# Patient Record
Sex: Male | Born: 1945
Health system: Southern US, Community
[De-identification: ages and names within clinical notes are randomized; demographics above are authoritative.]

## PROBLEM LIST (undated history)

## (undated) DIAGNOSIS — J302 Other seasonal allergic rhinitis: Secondary | ICD-10-CM

## (undated) DIAGNOSIS — I8390 Asymptomatic varicose veins of unspecified lower extremity: Secondary | ICD-10-CM

## (undated) DIAGNOSIS — I2510 Atherosclerotic heart disease of native coronary artery without angina pectoris: Secondary | ICD-10-CM

## (undated) DIAGNOSIS — J9819 Other pulmonary collapse: Secondary | ICD-10-CM

## (undated) DIAGNOSIS — S2239XA Fracture of one rib, unspecified side, initial encounter for closed fracture: Secondary | ICD-10-CM

## (undated) DIAGNOSIS — I10 Essential (primary) hypertension: Secondary | ICD-10-CM

## (undated) DIAGNOSIS — L859 Epidermal thickening, unspecified: Secondary | ICD-10-CM

## (undated) DIAGNOSIS — N400 Enlarged prostate without lower urinary tract symptoms: Secondary | ICD-10-CM

## (undated) DIAGNOSIS — H9319 Tinnitus, unspecified ear: Secondary | ICD-10-CM

## (undated) DIAGNOSIS — Z87442 Personal history of urinary calculi: Secondary | ICD-10-CM

## (undated) HISTORY — PX: CORONARY ARTERY BYPASS GRAFT: SHX141

## (undated) HISTORY — DX: Other seasonal allergic rhinitis: J30.2

## (undated) HISTORY — DX: Personal history of urinary calculi: Z87.442

## (undated) HISTORY — DX: Other pulmonary collapse: J98.19

## (undated) HISTORY — DX: Tinnitus, unspecified ear: H93.19

## (undated) HISTORY — DX: Asymptomatic varicose veins of unspecified lower extremity: I83.90

## (undated) HISTORY — DX: Epidermal thickening, unspecified: L85.9

## (undated) HISTORY — DX: Atherosclerotic heart disease of native coronary artery without angina pectoris: I25.10

## (undated) HISTORY — DX: Fracture of one rib, unspecified side, initial encounter for closed fracture: S22.39XA

## (undated) HISTORY — DX: Benign prostatic hyperplasia without lower urinary tract symptoms: N40.0

## (undated) HISTORY — DX: Essential (primary) hypertension: I10

---

## 1979-06-02 HISTORY — PX: NOSE SURGERY: SHX723

## 1986-06-01 HISTORY — PX: TRANSURETHRAL RESECTION OF PROSTATE: SHX73

## 2003-06-02 HISTORY — PX: HERNIA REPAIR: SHX51

## 2012-06-01 HISTORY — PX: VARICOSE VEIN SURGERY: SHX832

## 2015-07-08 DIAGNOSIS — I358 Other nonrheumatic aortic valve disorders: Secondary | ICD-10-CM | POA: Diagnosis not present

## 2015-07-08 DIAGNOSIS — Z87442 Personal history of urinary calculi: Secondary | ICD-10-CM | POA: Diagnosis not present

## 2015-07-08 DIAGNOSIS — I2510 Atherosclerotic heart disease of native coronary artery without angina pectoris: Secondary | ICD-10-CM | POA: Diagnosis not present

## 2015-07-08 DIAGNOSIS — I679 Cerebrovascular disease, unspecified: Secondary | ICD-10-CM | POA: Diagnosis not present

## 2015-07-08 DIAGNOSIS — I519 Heart disease, unspecified: Secondary | ICD-10-CM | POA: Diagnosis not present

## 2015-07-08 DIAGNOSIS — I6523 Occlusion and stenosis of bilateral carotid arteries: Secondary | ICD-10-CM | POA: Diagnosis not present

## 2015-07-08 DIAGNOSIS — J00 Acute nasopharyngitis [common cold]: Secondary | ICD-10-CM | POA: Diagnosis not present

## 2015-07-08 DIAGNOSIS — K429 Umbilical hernia without obstruction or gangrene: Secondary | ICD-10-CM | POA: Diagnosis not present

## 2015-07-08 DIAGNOSIS — I77810 Thoracic aortic ectasia: Secondary | ICD-10-CM | POA: Diagnosis not present

## 2015-07-08 DIAGNOSIS — Z6828 Body mass index (BMI) 28.0-28.9, adult: Secondary | ICD-10-CM | POA: Diagnosis not present

## 2015-07-08 DIAGNOSIS — N401 Enlarged prostate with lower urinary tract symptoms: Secondary | ICD-10-CM | POA: Diagnosis not present

## 2015-07-08 DIAGNOSIS — Z951 Presence of aortocoronary bypass graft: Secondary | ICD-10-CM | POA: Diagnosis not present

## 2015-07-08 DIAGNOSIS — I10 Essential (primary) hypertension: Secondary | ICD-10-CM | POA: Diagnosis not present

## 2015-07-08 DIAGNOSIS — E785 Hyperlipidemia, unspecified: Secondary | ICD-10-CM | POA: Diagnosis not present

## 2015-09-08 DIAGNOSIS — J0410 Acute tracheitis without obstruction: Secondary | ICD-10-CM | POA: Diagnosis not present

## 2016-01-07 DIAGNOSIS — I6523 Occlusion and stenosis of bilateral carotid arteries: Secondary | ICD-10-CM | POA: Diagnosis not present

## 2016-01-07 DIAGNOSIS — Z951 Presence of aortocoronary bypass graft: Secondary | ICD-10-CM | POA: Diagnosis not present

## 2016-01-07 DIAGNOSIS — E785 Hyperlipidemia, unspecified: Secondary | ICD-10-CM | POA: Diagnosis not present

## 2016-01-07 DIAGNOSIS — I10 Essential (primary) hypertension: Secondary | ICD-10-CM | POA: Diagnosis not present

## 2016-01-07 DIAGNOSIS — R7301 Impaired fasting glucose: Secondary | ICD-10-CM | POA: Diagnosis not present

## 2016-01-07 DIAGNOSIS — I2510 Atherosclerotic heart disease of native coronary artery without angina pectoris: Secondary | ICD-10-CM | POA: Diagnosis not present

## 2016-01-07 DIAGNOSIS — Z6828 Body mass index (BMI) 28.0-28.9, adult: Secondary | ICD-10-CM | POA: Diagnosis not present

## 2016-01-07 DIAGNOSIS — I519 Heart disease, unspecified: Secondary | ICD-10-CM | POA: Diagnosis not present

## 2016-06-03 DIAGNOSIS — I77810 Thoracic aortic ectasia: Secondary | ICD-10-CM | POA: Diagnosis not present

## 2016-06-03 DIAGNOSIS — I358 Other nonrheumatic aortic valve disorders: Secondary | ICD-10-CM | POA: Diagnosis not present

## 2016-06-03 DIAGNOSIS — R809 Proteinuria, unspecified: Secondary | ICD-10-CM | POA: Diagnosis not present

## 2016-06-03 DIAGNOSIS — Z87442 Personal history of urinary calculi: Secondary | ICD-10-CM | POA: Diagnosis not present

## 2016-06-03 DIAGNOSIS — R319 Hematuria, unspecified: Secondary | ICD-10-CM | POA: Diagnosis not present

## 2016-06-03 DIAGNOSIS — R310 Gross hematuria: Secondary | ICD-10-CM | POA: Diagnosis not present

## 2016-07-07 DIAGNOSIS — I519 Heart disease, unspecified: Secondary | ICD-10-CM | POA: Diagnosis not present

## 2016-07-07 DIAGNOSIS — I679 Cerebrovascular disease, unspecified: Secondary | ICD-10-CM | POA: Diagnosis not present

## 2016-07-07 DIAGNOSIS — I10 Essential (primary) hypertension: Secondary | ICD-10-CM | POA: Diagnosis not present

## 2016-07-07 DIAGNOSIS — Z951 Presence of aortocoronary bypass graft: Secondary | ICD-10-CM | POA: Diagnosis not present

## 2016-07-07 DIAGNOSIS — I493 Ventricular premature depolarization: Secondary | ICD-10-CM | POA: Diagnosis not present

## 2016-07-07 DIAGNOSIS — E785 Hyperlipidemia, unspecified: Secondary | ICD-10-CM | POA: Diagnosis not present

## 2016-07-07 DIAGNOSIS — I6523 Occlusion and stenosis of bilateral carotid arteries: Secondary | ICD-10-CM | POA: Diagnosis not present

## 2016-07-07 DIAGNOSIS — Z1211 Encounter for screening for malignant neoplasm of colon: Secondary | ICD-10-CM | POA: Diagnosis not present

## 2016-07-07 DIAGNOSIS — I2510 Atherosclerotic heart disease of native coronary artery without angina pectoris: Secondary | ICD-10-CM | POA: Diagnosis not present

## 2016-07-07 DIAGNOSIS — I77810 Thoracic aortic ectasia: Secondary | ICD-10-CM | POA: Diagnosis not present

## 2017-02-15 DIAGNOSIS — M5387 Other specified dorsopathies, lumbosacral region: Secondary | ICD-10-CM | POA: Diagnosis not present

## 2017-02-15 DIAGNOSIS — I25810 Atherosclerosis of coronary artery bypass graft(s) without angina pectoris: Secondary | ICD-10-CM | POA: Diagnosis not present

## 2017-02-15 DIAGNOSIS — M5136 Other intervertebral disc degeneration, lumbar region: Secondary | ICD-10-CM | POA: Diagnosis not present

## 2017-02-15 DIAGNOSIS — Z1389 Encounter for screening for other disorder: Secondary | ICD-10-CM | POA: Diagnosis not present

## 2017-02-15 DIAGNOSIS — N400 Enlarged prostate without lower urinary tract symptoms: Secondary | ICD-10-CM | POA: Diagnosis not present

## 2017-02-15 DIAGNOSIS — M9904 Segmental and somatic dysfunction of sacral region: Secondary | ICD-10-CM | POA: Diagnosis not present

## 2017-02-15 DIAGNOSIS — M9903 Segmental and somatic dysfunction of lumbar region: Secondary | ICD-10-CM | POA: Diagnosis not present

## 2017-02-15 DIAGNOSIS — Q72812 Congenital shortening of left lower limb: Secondary | ICD-10-CM | POA: Diagnosis not present

## 2017-02-15 DIAGNOSIS — M5386 Other specified dorsopathies, lumbar region: Secondary | ICD-10-CM | POA: Diagnosis not present

## 2017-02-15 DIAGNOSIS — E7800 Pure hypercholesterolemia, unspecified: Secondary | ICD-10-CM | POA: Diagnosis not present

## 2017-02-15 DIAGNOSIS — M9905 Segmental and somatic dysfunction of pelvic region: Secondary | ICD-10-CM | POA: Diagnosis not present

## 2017-02-15 DIAGNOSIS — I10 Essential (primary) hypertension: Secondary | ICD-10-CM | POA: Diagnosis not present

## 2017-02-15 DIAGNOSIS — M5137 Other intervertebral disc degeneration, lumbosacral region: Secondary | ICD-10-CM | POA: Diagnosis not present

## 2017-02-15 DIAGNOSIS — N200 Calculus of kidney: Secondary | ICD-10-CM | POA: Diagnosis not present

## 2017-02-18 DIAGNOSIS — Q72812 Congenital shortening of left lower limb: Secondary | ICD-10-CM | POA: Diagnosis not present

## 2017-02-18 DIAGNOSIS — M5136 Other intervertebral disc degeneration, lumbar region: Secondary | ICD-10-CM | POA: Diagnosis not present

## 2017-02-18 DIAGNOSIS — M9904 Segmental and somatic dysfunction of sacral region: Secondary | ICD-10-CM | POA: Diagnosis not present

## 2017-02-18 DIAGNOSIS — M9905 Segmental and somatic dysfunction of pelvic region: Secondary | ICD-10-CM | POA: Diagnosis not present

## 2017-02-18 DIAGNOSIS — M5137 Other intervertebral disc degeneration, lumbosacral region: Secondary | ICD-10-CM | POA: Diagnosis not present

## 2017-02-18 DIAGNOSIS — M5387 Other specified dorsopathies, lumbosacral region: Secondary | ICD-10-CM | POA: Diagnosis not present

## 2017-02-18 DIAGNOSIS — M9903 Segmental and somatic dysfunction of lumbar region: Secondary | ICD-10-CM | POA: Diagnosis not present

## 2017-02-18 DIAGNOSIS — M5386 Other specified dorsopathies, lumbar region: Secondary | ICD-10-CM | POA: Diagnosis not present

## 2017-02-23 DIAGNOSIS — M5386 Other specified dorsopathies, lumbar region: Secondary | ICD-10-CM | POA: Diagnosis not present

## 2017-02-23 DIAGNOSIS — M5137 Other intervertebral disc degeneration, lumbosacral region: Secondary | ICD-10-CM | POA: Diagnosis not present

## 2017-02-23 DIAGNOSIS — M9905 Segmental and somatic dysfunction of pelvic region: Secondary | ICD-10-CM | POA: Diagnosis not present

## 2017-02-23 DIAGNOSIS — M5387 Other specified dorsopathies, lumbosacral region: Secondary | ICD-10-CM | POA: Diagnosis not present

## 2017-02-23 DIAGNOSIS — M9904 Segmental and somatic dysfunction of sacral region: Secondary | ICD-10-CM | POA: Diagnosis not present

## 2017-02-23 DIAGNOSIS — M5136 Other intervertebral disc degeneration, lumbar region: Secondary | ICD-10-CM | POA: Diagnosis not present

## 2017-02-23 DIAGNOSIS — Q72812 Congenital shortening of left lower limb: Secondary | ICD-10-CM | POA: Diagnosis not present

## 2017-02-23 DIAGNOSIS — M9903 Segmental and somatic dysfunction of lumbar region: Secondary | ICD-10-CM | POA: Diagnosis not present

## 2017-02-24 DIAGNOSIS — Q72812 Congenital shortening of left lower limb: Secondary | ICD-10-CM | POA: Diagnosis not present

## 2017-02-24 DIAGNOSIS — M5137 Other intervertebral disc degeneration, lumbosacral region: Secondary | ICD-10-CM | POA: Diagnosis not present

## 2017-02-24 DIAGNOSIS — M5387 Other specified dorsopathies, lumbosacral region: Secondary | ICD-10-CM | POA: Diagnosis not present

## 2017-02-24 DIAGNOSIS — M9904 Segmental and somatic dysfunction of sacral region: Secondary | ICD-10-CM | POA: Diagnosis not present

## 2017-02-24 DIAGNOSIS — M5386 Other specified dorsopathies, lumbar region: Secondary | ICD-10-CM | POA: Diagnosis not present

## 2017-02-24 DIAGNOSIS — M5136 Other intervertebral disc degeneration, lumbar region: Secondary | ICD-10-CM | POA: Diagnosis not present

## 2017-02-24 DIAGNOSIS — M9903 Segmental and somatic dysfunction of lumbar region: Secondary | ICD-10-CM | POA: Diagnosis not present

## 2017-02-24 DIAGNOSIS — M9905 Segmental and somatic dysfunction of pelvic region: Secondary | ICD-10-CM | POA: Diagnosis not present

## 2017-02-25 DIAGNOSIS — M9904 Segmental and somatic dysfunction of sacral region: Secondary | ICD-10-CM | POA: Diagnosis not present

## 2017-02-25 DIAGNOSIS — M5386 Other specified dorsopathies, lumbar region: Secondary | ICD-10-CM | POA: Diagnosis not present

## 2017-02-25 DIAGNOSIS — M5137 Other intervertebral disc degeneration, lumbosacral region: Secondary | ICD-10-CM | POA: Diagnosis not present

## 2017-02-25 DIAGNOSIS — M9903 Segmental and somatic dysfunction of lumbar region: Secondary | ICD-10-CM | POA: Diagnosis not present

## 2017-02-25 DIAGNOSIS — M9905 Segmental and somatic dysfunction of pelvic region: Secondary | ICD-10-CM | POA: Diagnosis not present

## 2017-02-25 DIAGNOSIS — M5136 Other intervertebral disc degeneration, lumbar region: Secondary | ICD-10-CM | POA: Diagnosis not present

## 2017-02-25 DIAGNOSIS — Q72812 Congenital shortening of left lower limb: Secondary | ICD-10-CM | POA: Diagnosis not present

## 2017-02-25 DIAGNOSIS — M5387 Other specified dorsopathies, lumbosacral region: Secondary | ICD-10-CM | POA: Diagnosis not present

## 2017-03-01 DIAGNOSIS — M5137 Other intervertebral disc degeneration, lumbosacral region: Secondary | ICD-10-CM | POA: Diagnosis not present

## 2017-03-01 DIAGNOSIS — M5136 Other intervertebral disc degeneration, lumbar region: Secondary | ICD-10-CM | POA: Diagnosis not present

## 2017-03-01 DIAGNOSIS — M5386 Other specified dorsopathies, lumbar region: Secondary | ICD-10-CM | POA: Diagnosis not present

## 2017-03-01 DIAGNOSIS — M9905 Segmental and somatic dysfunction of pelvic region: Secondary | ICD-10-CM | POA: Diagnosis not present

## 2017-03-01 DIAGNOSIS — Q72812 Congenital shortening of left lower limb: Secondary | ICD-10-CM | POA: Diagnosis not present

## 2017-03-01 DIAGNOSIS — M9903 Segmental and somatic dysfunction of lumbar region: Secondary | ICD-10-CM | POA: Diagnosis not present

## 2017-03-01 DIAGNOSIS — M9904 Segmental and somatic dysfunction of sacral region: Secondary | ICD-10-CM | POA: Diagnosis not present

## 2017-03-01 DIAGNOSIS — M5387 Other specified dorsopathies, lumbosacral region: Secondary | ICD-10-CM | POA: Diagnosis not present

## 2017-03-08 DIAGNOSIS — M5136 Other intervertebral disc degeneration, lumbar region: Secondary | ICD-10-CM | POA: Diagnosis not present

## 2017-03-08 DIAGNOSIS — M5387 Other specified dorsopathies, lumbosacral region: Secondary | ICD-10-CM | POA: Diagnosis not present

## 2017-03-08 DIAGNOSIS — M9904 Segmental and somatic dysfunction of sacral region: Secondary | ICD-10-CM | POA: Diagnosis not present

## 2017-03-08 DIAGNOSIS — M9905 Segmental and somatic dysfunction of pelvic region: Secondary | ICD-10-CM | POA: Diagnosis not present

## 2017-03-08 DIAGNOSIS — M5137 Other intervertebral disc degeneration, lumbosacral region: Secondary | ICD-10-CM | POA: Diagnosis not present

## 2017-03-08 DIAGNOSIS — M5386 Other specified dorsopathies, lumbar region: Secondary | ICD-10-CM | POA: Diagnosis not present

## 2017-03-08 DIAGNOSIS — Q72812 Congenital shortening of left lower limb: Secondary | ICD-10-CM | POA: Diagnosis not present

## 2017-03-08 DIAGNOSIS — M9903 Segmental and somatic dysfunction of lumbar region: Secondary | ICD-10-CM | POA: Diagnosis not present

## 2017-03-09 DIAGNOSIS — M9905 Segmental and somatic dysfunction of pelvic region: Secondary | ICD-10-CM | POA: Diagnosis not present

## 2017-03-09 DIAGNOSIS — Q72812 Congenital shortening of left lower limb: Secondary | ICD-10-CM | POA: Diagnosis not present

## 2017-03-09 DIAGNOSIS — M5136 Other intervertebral disc degeneration, lumbar region: Secondary | ICD-10-CM | POA: Diagnosis not present

## 2017-03-09 DIAGNOSIS — M5387 Other specified dorsopathies, lumbosacral region: Secondary | ICD-10-CM | POA: Diagnosis not present

## 2017-03-09 DIAGNOSIS — M5137 Other intervertebral disc degeneration, lumbosacral region: Secondary | ICD-10-CM | POA: Diagnosis not present

## 2017-03-09 DIAGNOSIS — M5386 Other specified dorsopathies, lumbar region: Secondary | ICD-10-CM | POA: Diagnosis not present

## 2017-03-09 DIAGNOSIS — M9903 Segmental and somatic dysfunction of lumbar region: Secondary | ICD-10-CM | POA: Diagnosis not present

## 2017-03-09 DIAGNOSIS — M9904 Segmental and somatic dysfunction of sacral region: Secondary | ICD-10-CM | POA: Diagnosis not present

## 2017-03-11 DIAGNOSIS — M5387 Other specified dorsopathies, lumbosacral region: Secondary | ICD-10-CM | POA: Diagnosis not present

## 2017-03-11 DIAGNOSIS — M5386 Other specified dorsopathies, lumbar region: Secondary | ICD-10-CM | POA: Diagnosis not present

## 2017-03-11 DIAGNOSIS — M9903 Segmental and somatic dysfunction of lumbar region: Secondary | ICD-10-CM | POA: Diagnosis not present

## 2017-03-11 DIAGNOSIS — Q72812 Congenital shortening of left lower limb: Secondary | ICD-10-CM | POA: Diagnosis not present

## 2017-03-11 DIAGNOSIS — M9905 Segmental and somatic dysfunction of pelvic region: Secondary | ICD-10-CM | POA: Diagnosis not present

## 2017-03-11 DIAGNOSIS — M9904 Segmental and somatic dysfunction of sacral region: Secondary | ICD-10-CM | POA: Diagnosis not present

## 2017-03-11 DIAGNOSIS — M5136 Other intervertebral disc degeneration, lumbar region: Secondary | ICD-10-CM | POA: Diagnosis not present

## 2017-03-11 DIAGNOSIS — M5137 Other intervertebral disc degeneration, lumbosacral region: Secondary | ICD-10-CM | POA: Diagnosis not present

## 2017-03-15 DIAGNOSIS — M9903 Segmental and somatic dysfunction of lumbar region: Secondary | ICD-10-CM | POA: Diagnosis not present

## 2017-03-15 DIAGNOSIS — M5386 Other specified dorsopathies, lumbar region: Secondary | ICD-10-CM | POA: Diagnosis not present

## 2017-03-15 DIAGNOSIS — M5136 Other intervertebral disc degeneration, lumbar region: Secondary | ICD-10-CM | POA: Diagnosis not present

## 2017-03-15 DIAGNOSIS — Q72812 Congenital shortening of left lower limb: Secondary | ICD-10-CM | POA: Diagnosis not present

## 2017-03-15 DIAGNOSIS — M5137 Other intervertebral disc degeneration, lumbosacral region: Secondary | ICD-10-CM | POA: Diagnosis not present

## 2017-03-15 DIAGNOSIS — M9905 Segmental and somatic dysfunction of pelvic region: Secondary | ICD-10-CM | POA: Diagnosis not present

## 2017-03-15 DIAGNOSIS — M5387 Other specified dorsopathies, lumbosacral region: Secondary | ICD-10-CM | POA: Diagnosis not present

## 2017-03-15 DIAGNOSIS — M9904 Segmental and somatic dysfunction of sacral region: Secondary | ICD-10-CM | POA: Diagnosis not present

## 2017-03-16 DIAGNOSIS — M5387 Other specified dorsopathies, lumbosacral region: Secondary | ICD-10-CM | POA: Diagnosis not present

## 2017-03-16 DIAGNOSIS — M5386 Other specified dorsopathies, lumbar region: Secondary | ICD-10-CM | POA: Diagnosis not present

## 2017-03-16 DIAGNOSIS — M9903 Segmental and somatic dysfunction of lumbar region: Secondary | ICD-10-CM | POA: Diagnosis not present

## 2017-03-16 DIAGNOSIS — Q72812 Congenital shortening of left lower limb: Secondary | ICD-10-CM | POA: Diagnosis not present

## 2017-03-16 DIAGNOSIS — M5136 Other intervertebral disc degeneration, lumbar region: Secondary | ICD-10-CM | POA: Diagnosis not present

## 2017-03-16 DIAGNOSIS — M5137 Other intervertebral disc degeneration, lumbosacral region: Secondary | ICD-10-CM | POA: Diagnosis not present

## 2017-03-16 DIAGNOSIS — M9904 Segmental and somatic dysfunction of sacral region: Secondary | ICD-10-CM | POA: Diagnosis not present

## 2017-03-16 DIAGNOSIS — M9905 Segmental and somatic dysfunction of pelvic region: Secondary | ICD-10-CM | POA: Diagnosis not present

## 2017-03-18 DIAGNOSIS — M5387 Other specified dorsopathies, lumbosacral region: Secondary | ICD-10-CM | POA: Diagnosis not present

## 2017-03-18 DIAGNOSIS — M9905 Segmental and somatic dysfunction of pelvic region: Secondary | ICD-10-CM | POA: Diagnosis not present

## 2017-03-18 DIAGNOSIS — M5386 Other specified dorsopathies, lumbar region: Secondary | ICD-10-CM | POA: Diagnosis not present

## 2017-03-18 DIAGNOSIS — Q72812 Congenital shortening of left lower limb: Secondary | ICD-10-CM | POA: Diagnosis not present

## 2017-03-18 DIAGNOSIS — M5136 Other intervertebral disc degeneration, lumbar region: Secondary | ICD-10-CM | POA: Diagnosis not present

## 2017-03-18 DIAGNOSIS — M5137 Other intervertebral disc degeneration, lumbosacral region: Secondary | ICD-10-CM | POA: Diagnosis not present

## 2017-03-18 DIAGNOSIS — M9903 Segmental and somatic dysfunction of lumbar region: Secondary | ICD-10-CM | POA: Diagnosis not present

## 2017-03-18 DIAGNOSIS — M9904 Segmental and somatic dysfunction of sacral region: Secondary | ICD-10-CM | POA: Diagnosis not present

## 2017-03-22 DIAGNOSIS — Q72812 Congenital shortening of left lower limb: Secondary | ICD-10-CM | POA: Diagnosis not present

## 2017-03-22 DIAGNOSIS — M5387 Other specified dorsopathies, lumbosacral region: Secondary | ICD-10-CM | POA: Diagnosis not present

## 2017-03-22 DIAGNOSIS — M9903 Segmental and somatic dysfunction of lumbar region: Secondary | ICD-10-CM | POA: Diagnosis not present

## 2017-03-22 DIAGNOSIS — M9905 Segmental and somatic dysfunction of pelvic region: Secondary | ICD-10-CM | POA: Diagnosis not present

## 2017-03-22 DIAGNOSIS — M9904 Segmental and somatic dysfunction of sacral region: Secondary | ICD-10-CM | POA: Diagnosis not present

## 2017-03-22 DIAGNOSIS — M5386 Other specified dorsopathies, lumbar region: Secondary | ICD-10-CM | POA: Diagnosis not present

## 2017-03-22 DIAGNOSIS — M5137 Other intervertebral disc degeneration, lumbosacral region: Secondary | ICD-10-CM | POA: Diagnosis not present

## 2017-03-22 DIAGNOSIS — M5136 Other intervertebral disc degeneration, lumbar region: Secondary | ICD-10-CM | POA: Diagnosis not present

## 2017-03-24 DIAGNOSIS — M9903 Segmental and somatic dysfunction of lumbar region: Secondary | ICD-10-CM | POA: Diagnosis not present

## 2017-03-24 DIAGNOSIS — M9904 Segmental and somatic dysfunction of sacral region: Secondary | ICD-10-CM | POA: Diagnosis not present

## 2017-03-24 DIAGNOSIS — M5137 Other intervertebral disc degeneration, lumbosacral region: Secondary | ICD-10-CM | POA: Diagnosis not present

## 2017-03-24 DIAGNOSIS — Q72812 Congenital shortening of left lower limb: Secondary | ICD-10-CM | POA: Diagnosis not present

## 2017-03-24 DIAGNOSIS — M5136 Other intervertebral disc degeneration, lumbar region: Secondary | ICD-10-CM | POA: Diagnosis not present

## 2017-03-24 DIAGNOSIS — M5387 Other specified dorsopathies, lumbosacral region: Secondary | ICD-10-CM | POA: Diagnosis not present

## 2017-03-24 DIAGNOSIS — M5386 Other specified dorsopathies, lumbar region: Secondary | ICD-10-CM | POA: Diagnosis not present

## 2017-03-24 DIAGNOSIS — M9905 Segmental and somatic dysfunction of pelvic region: Secondary | ICD-10-CM | POA: Diagnosis not present

## 2017-03-25 DIAGNOSIS — M9903 Segmental and somatic dysfunction of lumbar region: Secondary | ICD-10-CM | POA: Diagnosis not present

## 2017-03-25 DIAGNOSIS — M9904 Segmental and somatic dysfunction of sacral region: Secondary | ICD-10-CM | POA: Diagnosis not present

## 2017-03-25 DIAGNOSIS — Q72812 Congenital shortening of left lower limb: Secondary | ICD-10-CM | POA: Diagnosis not present

## 2017-03-25 DIAGNOSIS — M5386 Other specified dorsopathies, lumbar region: Secondary | ICD-10-CM | POA: Diagnosis not present

## 2017-03-25 DIAGNOSIS — M5137 Other intervertebral disc degeneration, lumbosacral region: Secondary | ICD-10-CM | POA: Diagnosis not present

## 2017-03-25 DIAGNOSIS — M5387 Other specified dorsopathies, lumbosacral region: Secondary | ICD-10-CM | POA: Diagnosis not present

## 2017-03-25 DIAGNOSIS — M9905 Segmental and somatic dysfunction of pelvic region: Secondary | ICD-10-CM | POA: Diagnosis not present

## 2017-03-25 DIAGNOSIS — M5136 Other intervertebral disc degeneration, lumbar region: Secondary | ICD-10-CM | POA: Diagnosis not present

## 2017-03-29 DIAGNOSIS — M5136 Other intervertebral disc degeneration, lumbar region: Secondary | ICD-10-CM | POA: Diagnosis not present

## 2017-03-29 DIAGNOSIS — M9903 Segmental and somatic dysfunction of lumbar region: Secondary | ICD-10-CM | POA: Diagnosis not present

## 2017-03-29 DIAGNOSIS — M9904 Segmental and somatic dysfunction of sacral region: Secondary | ICD-10-CM | POA: Diagnosis not present

## 2017-03-29 DIAGNOSIS — M5137 Other intervertebral disc degeneration, lumbosacral region: Secondary | ICD-10-CM | POA: Diagnosis not present

## 2017-03-29 DIAGNOSIS — M9905 Segmental and somatic dysfunction of pelvic region: Secondary | ICD-10-CM | POA: Diagnosis not present

## 2017-03-29 DIAGNOSIS — M5386 Other specified dorsopathies, lumbar region: Secondary | ICD-10-CM | POA: Diagnosis not present

## 2017-03-29 DIAGNOSIS — M5387 Other specified dorsopathies, lumbosacral region: Secondary | ICD-10-CM | POA: Diagnosis not present

## 2017-03-29 DIAGNOSIS — Q72812 Congenital shortening of left lower limb: Secondary | ICD-10-CM | POA: Diagnosis not present

## 2017-03-31 DIAGNOSIS — M9905 Segmental and somatic dysfunction of pelvic region: Secondary | ICD-10-CM | POA: Diagnosis not present

## 2017-03-31 DIAGNOSIS — Q72812 Congenital shortening of left lower limb: Secondary | ICD-10-CM | POA: Diagnosis not present

## 2017-03-31 DIAGNOSIS — M9903 Segmental and somatic dysfunction of lumbar region: Secondary | ICD-10-CM | POA: Diagnosis not present

## 2017-03-31 DIAGNOSIS — M5386 Other specified dorsopathies, lumbar region: Secondary | ICD-10-CM | POA: Diagnosis not present

## 2017-03-31 DIAGNOSIS — M5387 Other specified dorsopathies, lumbosacral region: Secondary | ICD-10-CM | POA: Diagnosis not present

## 2017-03-31 DIAGNOSIS — M9904 Segmental and somatic dysfunction of sacral region: Secondary | ICD-10-CM | POA: Diagnosis not present

## 2017-03-31 DIAGNOSIS — M5136 Other intervertebral disc degeneration, lumbar region: Secondary | ICD-10-CM | POA: Diagnosis not present

## 2017-03-31 DIAGNOSIS — M5137 Other intervertebral disc degeneration, lumbosacral region: Secondary | ICD-10-CM | POA: Diagnosis not present

## 2017-04-07 ENCOUNTER — Emergency Department (HOSPITAL_COMMUNITY)
Admission: EM | Admit: 2017-04-07 | Discharge: 2017-04-07 | Disposition: A | Discharge status: Home / Self Care | Payer: Medicare Other | Attending: Emergency Medicine | Admitting: Emergency Medicine

## 2017-04-07 ENCOUNTER — Encounter (HOSPITAL_COMMUNITY): Payer: Self-pay | Admitting: *Deleted

## 2017-04-07 ENCOUNTER — Emergency Department (HOSPITAL_COMMUNITY): Payer: Medicare Other

## 2017-04-07 DIAGNOSIS — M25562 Pain in left knee: Secondary | ICD-10-CM | POA: Insufficient documentation

## 2017-04-07 DIAGNOSIS — Z791 Long term (current) use of non-steroidal anti-inflammatories (NSAID): Secondary | ICD-10-CM | POA: Diagnosis not present

## 2017-04-07 DIAGNOSIS — G8929 Other chronic pain: Secondary | ICD-10-CM | POA: Diagnosis not present

## 2017-04-07 DIAGNOSIS — Z7982 Long term (current) use of aspirin: Secondary | ICD-10-CM | POA: Diagnosis not present

## 2017-04-07 DIAGNOSIS — M25552 Pain in left hip: Secondary | ICD-10-CM | POA: Insufficient documentation

## 2017-04-07 DIAGNOSIS — M25551 Pain in right hip: Secondary | ICD-10-CM | POA: Diagnosis not present

## 2017-04-07 DIAGNOSIS — Z79899 Other long term (current) drug therapy: Secondary | ICD-10-CM | POA: Diagnosis not present

## 2017-04-07 DIAGNOSIS — M25561 Pain in right knee: Secondary | ICD-10-CM | POA: Diagnosis not present

## 2017-04-07 DIAGNOSIS — I2510 Atherosclerotic heart disease of native coronary artery without angina pectoris: Secondary | ICD-10-CM | POA: Insufficient documentation

## 2017-04-07 DIAGNOSIS — M545 Low back pain: Secondary | ICD-10-CM | POA: Insufficient documentation

## 2017-04-07 HISTORY — DX: Atherosclerotic heart disease of native coronary artery without angina pectoris: I25.10

## 2017-04-07 IMAGING — CR DG HIP (WITH OR WITHOUT PELVIS) 2-3V*L*
3 series · 3 of 3 positions shown · non-contrast
Comparison: None.

CLINICAL DATA: Left hip pain.  No known injury.

EXAM:
DG HIP (WITH OR WITHOUT PELVIS) 2-3V LEFT

[pelvis ap]
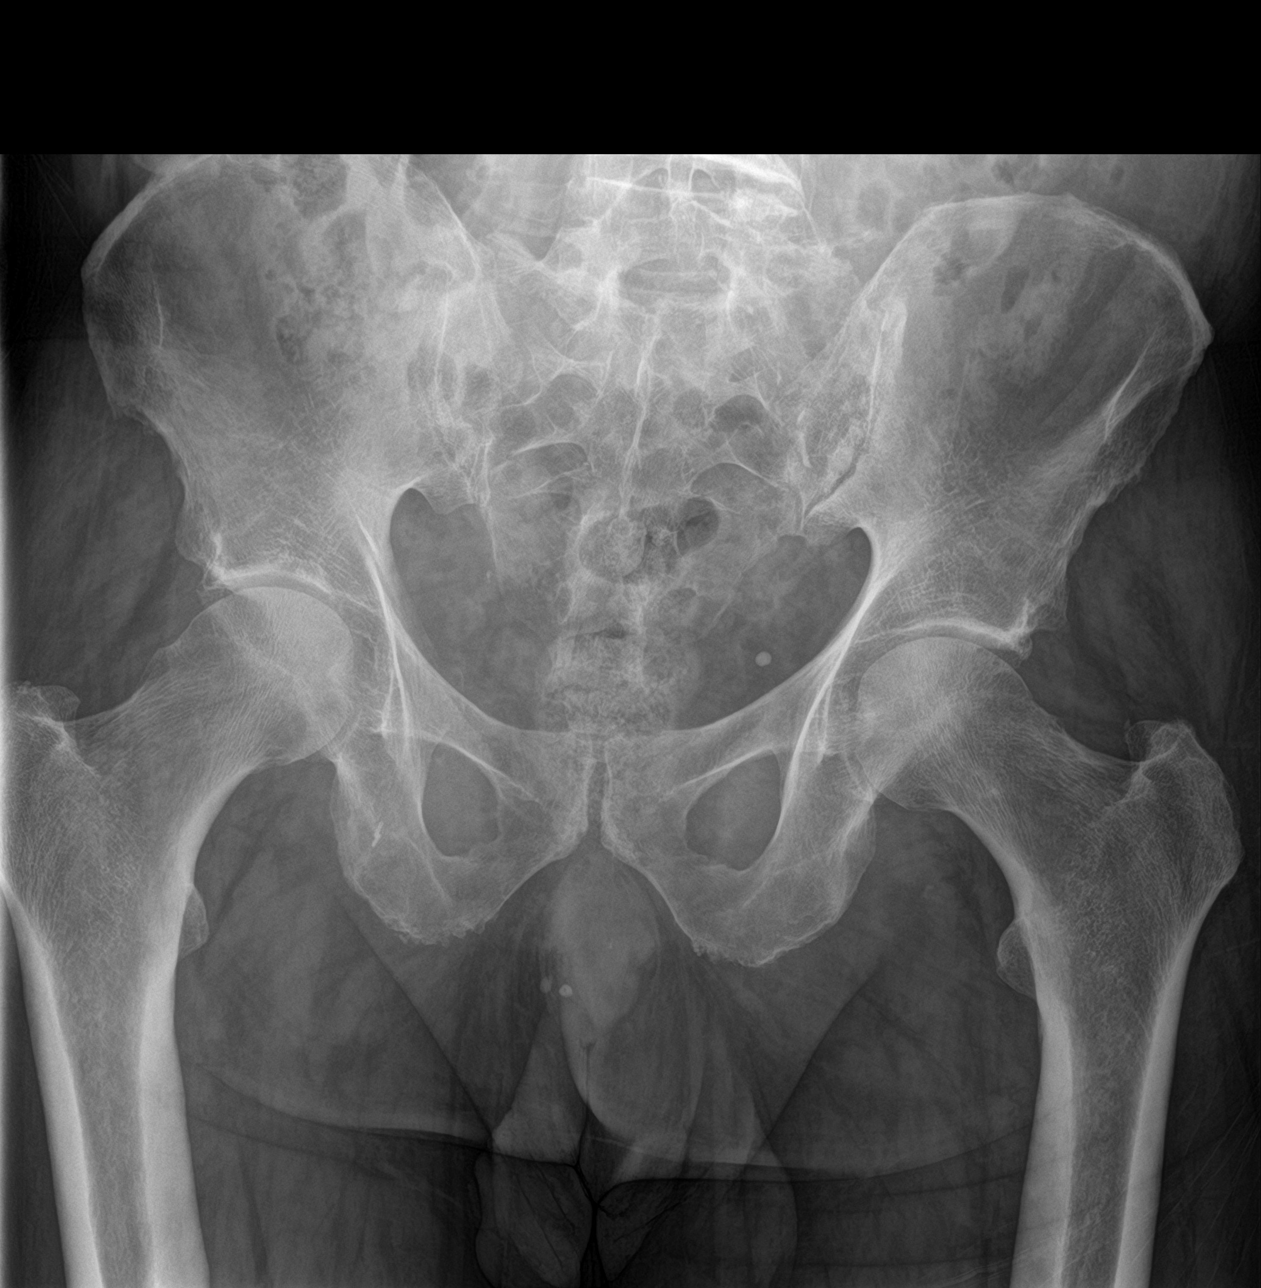

[hip ap]
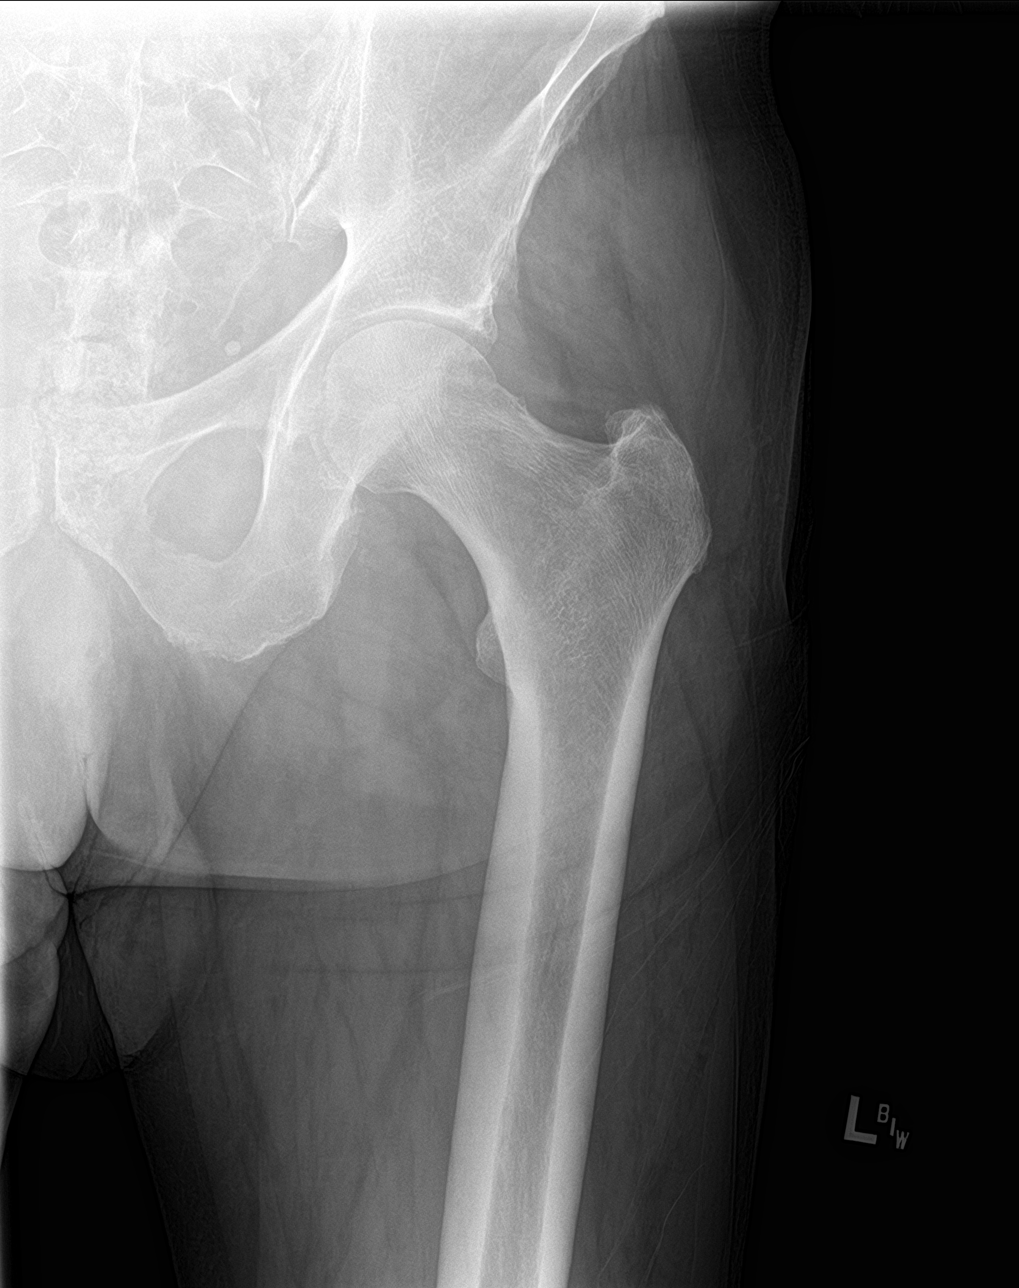

[hip lat]
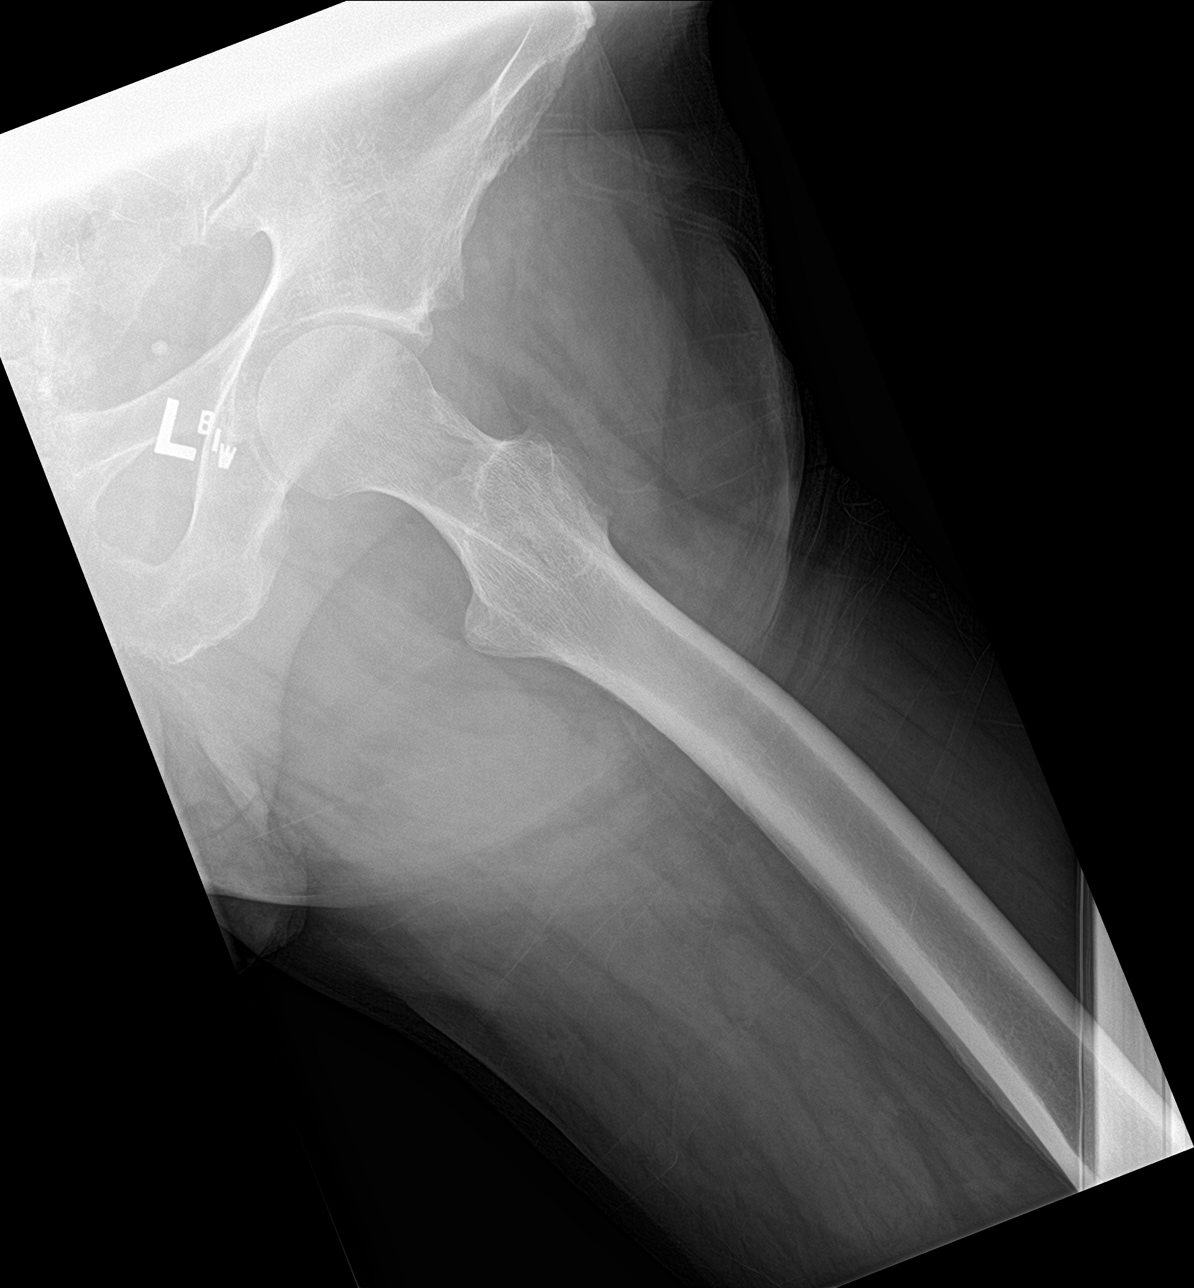

[3 of 3 positions shown; findings below may reference images not displayed]

FINDINGS: No acute bony or joint abnormality is seen about either hip. No
notable degenerative change is seen. Varus angulation of the femoral
necks appears symmetric from right to left and is consistent with
congenital anomaly. Surrounding soft tissue structures appear
normal.
IMPRESSION: No acute abnormality or finding to explain the patient's symptoms.

## 2017-04-07 IMAGING — CR DG KNEE COMPLETE 4+V*L*
4 series · 4 of 4 positions shown · non-contrast
Comparison: None in PACs

CLINICAL DATA: Generalize left knee pain worsening over the past
month.

EXAM:
LEFT KNEE - COMPLETE 4+ VIEW

[knee ap]
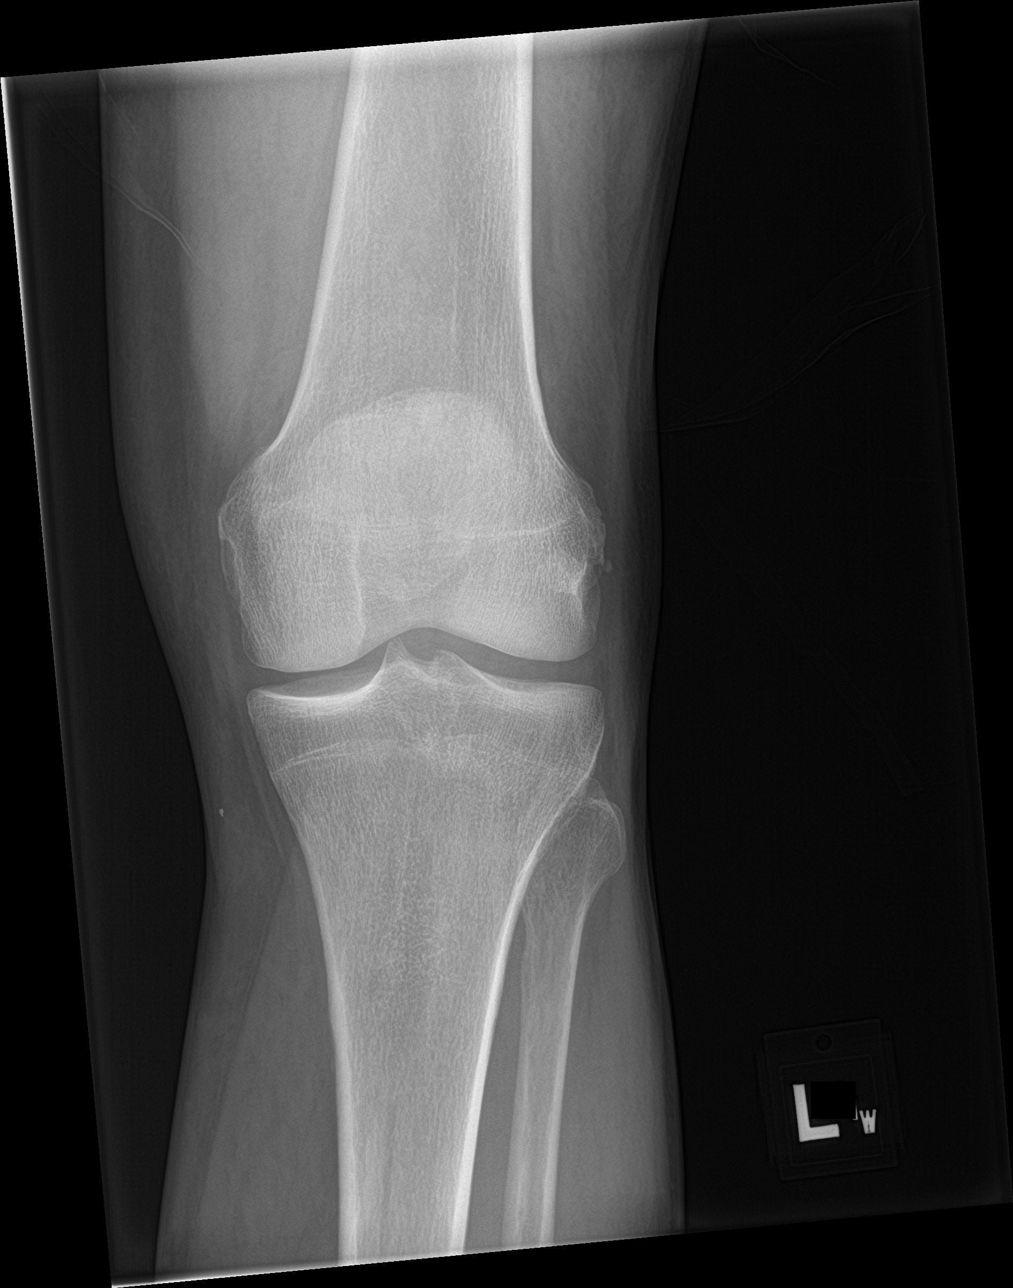

[knee lat]
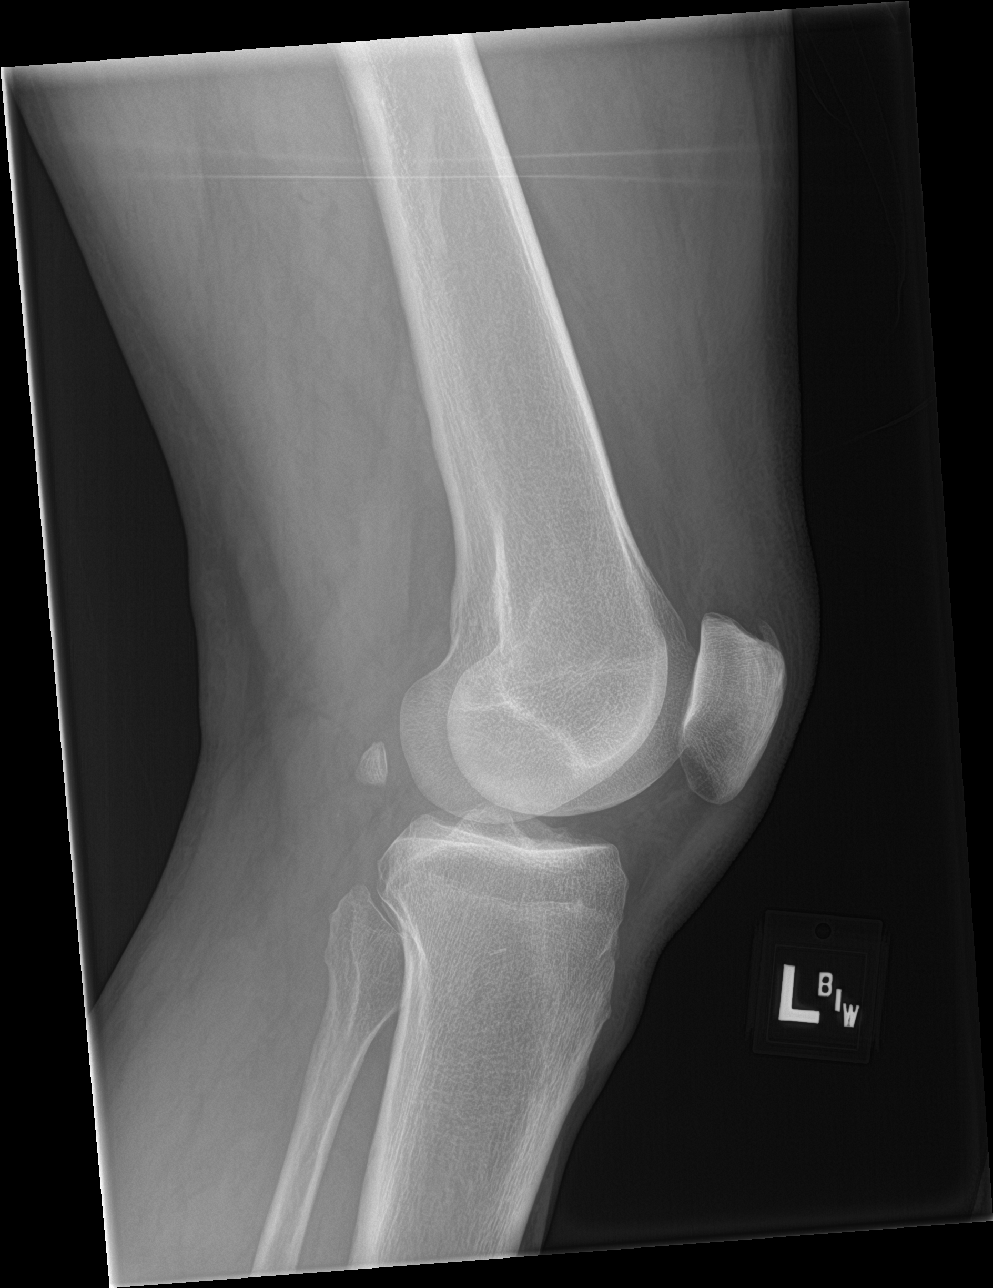

[knee obl (1 of 2)]
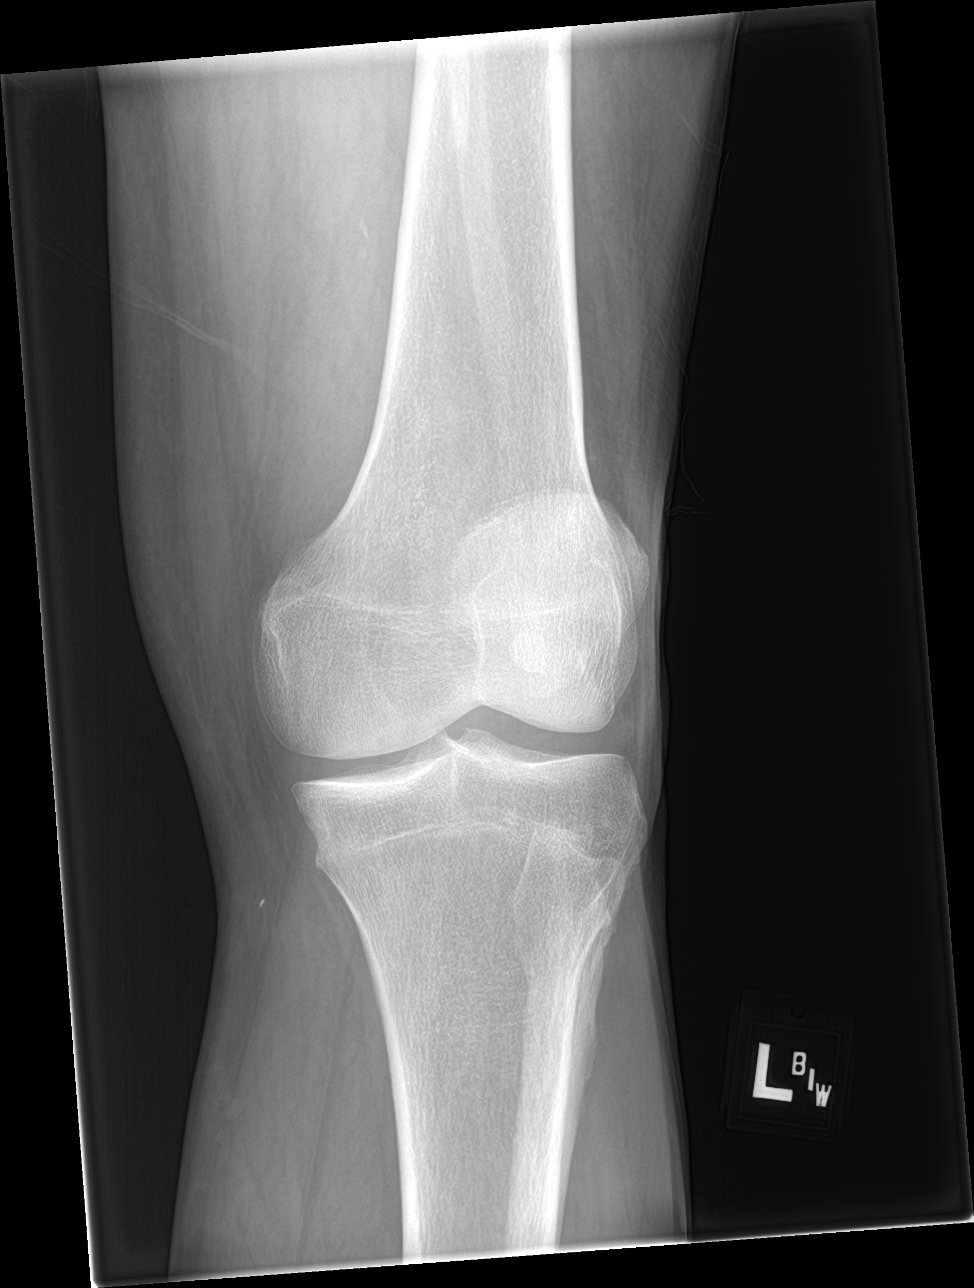

[knee obl (2 of 2)]
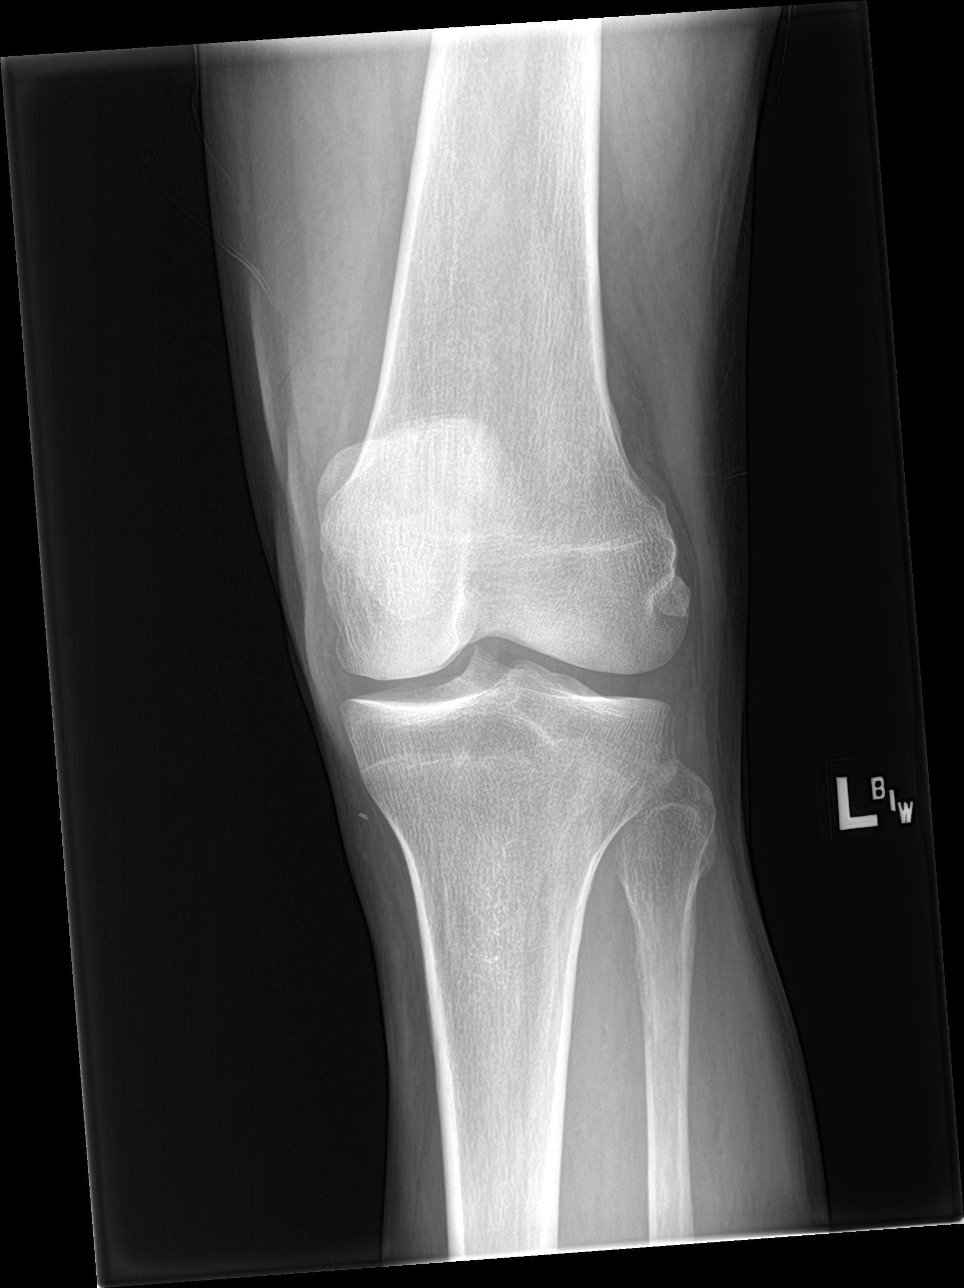

[4 of 4 positions shown; findings below may reference images not displayed]

FINDINGS: The bones are subjectively adequately mineralized. The joint spaces
are well maintained. There is mild beaking of the medial tibial
spine. There is no acute or healing fracture. There is no joint
effusion.
IMPRESSION: There is no acute or significant chronic bony abnormality of the
left knee.

## 2017-04-07 MED ORDER — HYDROCODONE-ACETAMINOPHEN 5-325 MG PO TABS: 1.0000 | ORAL_TABLET | ORAL | 0 refills | Status: DC | PRN

## 2017-04-07 NOTE — ED Provider Notes (Signed)
[facility] EMERGENCY DEPARTMENT Provider Note   CSN: [id number] Arrival date & time: 04/07/17  3614     History   Chief Complaint Chief Complaint  Patient presents with  . Knee Pain  . Back Pain    HPI Michael Spencer is a 71 y.o. male.  HPI   71 year old male presents today with complaints of left hip and back pain.  Patient notes he lifelong history of his left leg being shorter than his right.  He notes he has off and on left leg and knee pain.  He notes over the last several months symptoms have slightly worsened.  He notes that he wanted to see a chiropractor who started placing inserts into his shoes.  He notes this significantly worsened his hip and knee pain to the point where he is unable to ambulate.  He denies any significant swelling or edema, warmth to touch, trauma.  He denies any significant neurological deficits other than his baseline numbness in his feet bilateral.  He denies any red flags for back pain.  Patient reports he is had difficulty sleeping due to the pain.  He has used over-the-counter medications without symptomatic improvement.  He reports the pain in his hip runs from the posterior aspect of the hip down the left posterior and lateral side with a burning sensation.    Past Medical History:  Diagnosis Date  . Coronary artery disease     There are no active problems to display for this patient.   Past Surgical History:  Procedure Laterality Date  . CORONARY ARTERY BYPASS GRAFT         Home Medications    Prior to Admission medications   Medication Sig Start Date End Date Taking? Authorizing Provider  aspirin EC 81 MG tablet Take 162 mg daily by mouth.   Yes [provider]  atorvastatin (LIPITOR) 40 MG tablet Take 40 mg at bedtime by mouth. 02/15/17  Yes [provider]  fluticasone (FLONASE) 50 MCG/ACT nasal spray Place 2 sprays daily as needed into both nostrils for allergies or rhinitis.   Yes  [provider]  ibuprofen (ADVIL,MOTRIN) 200 MG tablet Take 600 mg every 6 (six) hours as needed by mouth for mild pain.   Yes [provider]  losartan (COZAAR) 25 MG tablet Take 25 mg daily by mouth. 02/04/17  Yes [provider]  metoprolol tartrate (LOPRESSOR) 25 MG tablet Take 12.5 mg 2 (two) times daily by mouth. 02/04/17  Yes [provider]  OVER THE COUNTER MEDICATION Take 15 mLs daily by mouth. RELIVE 15ML POWDER IN 8OZ   Yes [provider]  HYDROcodone-acetaminophen (NORCO/VICODIN) 5-325 MG tablet Take 1 tablet every 4 (four) hours as needed by mouth. 04/07/17   Okey Regal, PA-C    Family History History reviewed. No pertinent family history.  Social History Social History   Tobacco Use  . Smoking status: Never Smoker  Substance Use Topics  . Alcohol use: No    Frequency: Never  . Drug use: No     Allergies   Percocet [oxycodone-acetaminophen]   Review of Systems Review of Systems  All other systems reviewed and are negative.    Physical Exam Updated Vital Signs BP (!) 130/95 (BP Location: Right Arm)   Pulse 60   Temp 98.2 F (36.8 C) (Oral)   Resp 18   SpO2 98%   Physical Exam  Constitutional: He is oriented to person, place, and time. He appears well-developed and  well-nourished.  HENT:  Head: Normocephalic and atraumatic.  Eyes: Conjunctivae are normal. Pupils are equal, round, and reactive to light. Right eye exhibits no discharge. Left eye exhibits no discharge. No scleral icterus.  Neck: Normal range of motion. No JVD present. No tracheal deviation present.  Pulmonary/Chest: Effort normal. No stridor.  Musculoskeletal:  No CT or L-spine tenderness palpation.  There is palpation of the left posterior hip.  No swelling or edema.  Right lower extremity without swelling or edema.  Right knee atraumatic, no redness or warmth to touch.  No significant tenderness to palpation, no significant laxity.    Neurological: He is alert and oriented to person, place, and time. Coordination normal.  Psychiatric: He has a normal mood and affect. His behavior is normal. Judgment and thought content normal.  Nursing note and vitals reviewed.    ED Treatments / Results  Labs (all labs ordered are listed, but only abnormal results are displayed) Labs Reviewed - No data to display  EKG  EKG Interpretation None       Radiology Dg Knee Complete 4 Views Left  Result Date: 04/07/2017 CLINICAL DATA:  Generalize left knee pain worsening over the past month. EXAM: LEFT KNEE - COMPLETE 4+ VIEW COMPARISON:  None in PACs FINDINGS: The bones are subjectively adequately mineralized. The joint spaces are well maintained. There is mild beaking of the medial tibial spine. There is no acute or healing fracture. There is no joint effusion. IMPRESSION: There is no acute or significant chronic bony abnormality of the left knee. Electronically Signed   By: David  Martinique M.D.   On: 04/07/2017 16:00   Dg Hip Unilat W Or Wo Pelvis 2-3 Views Left  Result Date: 04/07/2017 CLINICAL DATA:  Left hip pain.  No known injury. EXAM: DG HIP (WITH OR WITHOUT PELVIS) 2-3V LEFT COMPARISON:  None. FINDINGS: No acute bony or joint abnormality is seen about either hip. No notable degenerative change is seen. Varus angulation of the femoral necks appears symmetric from right to left and is consistent with congenital anomaly. Surrounding soft tissue structures appear normal. IMPRESSION: No acute abnormality or finding to explain the patient's symptoms. Electronically Signed   By: Inge Rise M.D.   On: 04/07/2017 16:01    Procedures Procedures (including critical care time)  Medications Ordered in ED Medications - No data to display   Initial Impression / Assessment and Plan / ED Course  I have reviewed the triage vital signs and the nursing notes.  Pertinent labs & imaging results that were available during my care of the  patient were reviewed by me and considered in my medical decision making (see chart for details).      Final Clinical Impressions(s) / ED Diagnoses   Final diagnoses:  Acute pain of right knee  Pain of right hip joint     Labs:   Imaging: DG hip unilateral left, DG knee complete left  Consults:  Therapeutics:  Discharge Meds: Hydrocodone acetaminophen  Assessment/Plan:    71 year old male presents today with acute on chronic pain.  This is likely secondary to recent change in gait pattern.  He has plain films with no acute findings, no signs of infectious etiology, no acute neurological deficits that require further evaluation or management here in the ED setting.  Patient will be referred to orthopedic specialist for ongoing evaluation and management.  He is given strict return precautions.  He verbalized understanding and agreement to today's plan had  ED Discharge Orders  Ordered    HYDROcodone-acetaminophen (NORCO/VICODIN) 5-325 MG tablet  Every 4 hours PRN     04/07/17 1627       Francee Gentile 04/07/17 2035    Tegeler, Gwenyth Allegra, MD 04/07/17 2330

## 2017-04-07 NOTE — Discharge Instructions (Signed)
Please read attached information. If you experience any new or worsening signs or symptoms please return to the emergency room for evaluation. Please follow-up with your primary care provider or specialist as discussed. Please use medication prescribed only as directed and discontinue taking if you have any concerning signs or symptoms.   °

## 2017-04-07 NOTE — ED Triage Notes (Signed)
Pt reports left knee pain and lower back/left buttock pain that has been getting progressively worse over past month. Has been seeing chiropractor with no relief. Having difficulty sleeping and ambulating due to pain.

## 2017-04-13 DIAGNOSIS — M545 Low back pain: Secondary | ICD-10-CM | POA: Diagnosis not present

## 2017-04-15 DIAGNOSIS — M545 Low back pain: Secondary | ICD-10-CM | POA: Diagnosis not present

## 2017-04-16 ENCOUNTER — Other Ambulatory Visit: Payer: Self-pay | Admitting: Sports Medicine

## 2017-04-16 DIAGNOSIS — M545 Low back pain: Secondary | ICD-10-CM | POA: Diagnosis not present

## 2017-04-16 DIAGNOSIS — M5440 Lumbago with sciatica, unspecified side: Secondary | ICD-10-CM

## 2017-04-20 ENCOUNTER — Ambulatory Visit
Admission: RE | Admit: 2017-04-20 | Discharge: 2017-04-20 | Disposition: A | Discharge status: Home / Self Care | Payer: Medicare Other | Source: Ambulatory Visit | Attending: Sports Medicine | Admitting: Sports Medicine

## 2017-04-20 DIAGNOSIS — M5440 Lumbago with sciatica, unspecified side: Secondary | ICD-10-CM

## 2017-04-20 DIAGNOSIS — M5126 Other intervertebral disc displacement, lumbar region: Secondary | ICD-10-CM | POA: Diagnosis not present

## 2017-04-20 IMAGING — XA Imaging study
2 series · 2 of 2 positions shown · non-contrast
Comparison: none

CLINICAL DATA: Low back and left lower extremity pain. MR
demonstrates left protrusion L3-4. No previous lumbar surgery.

[Series 1: ortho standard · 1 of 1 slices shown (1 of 2)]
[im 1/1]
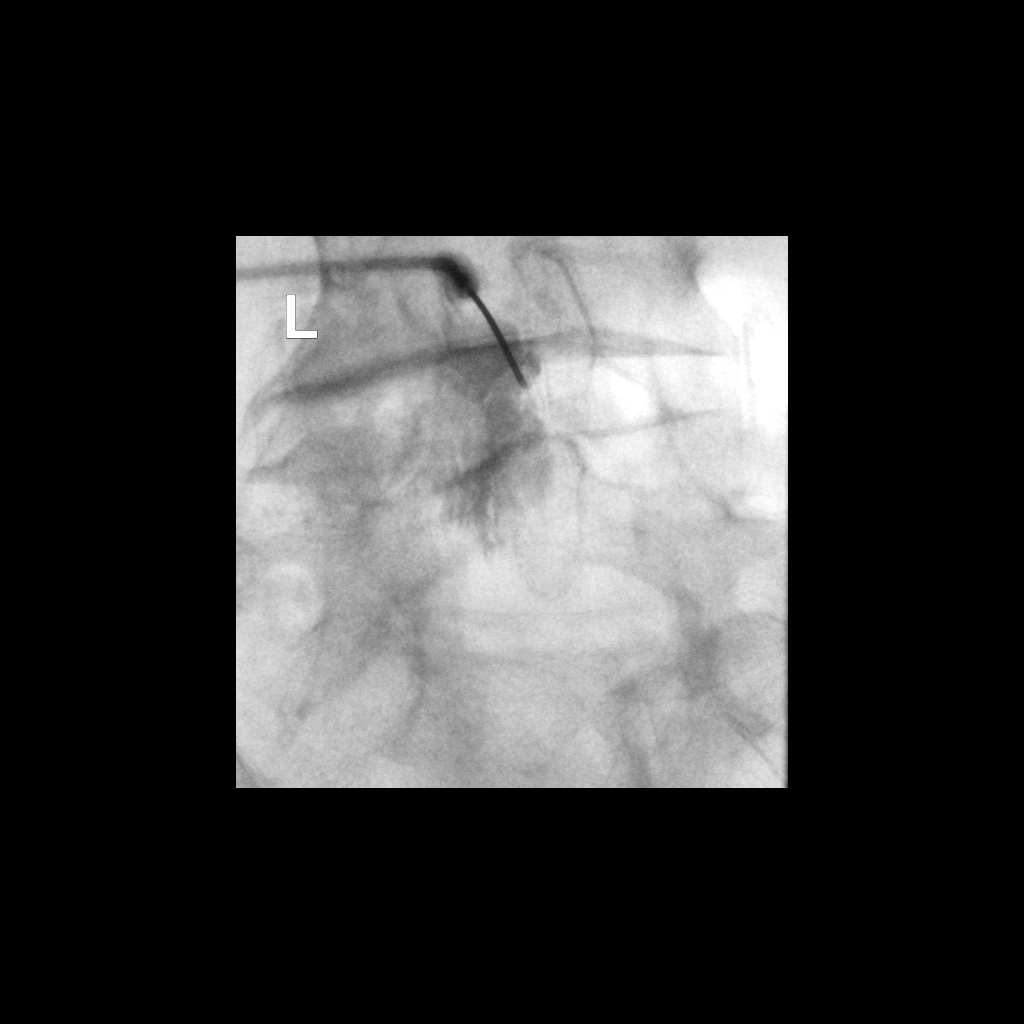

[Series 3: ortho standard · 1 of 1 slices shown (2 of 2)]
[im 1/1]
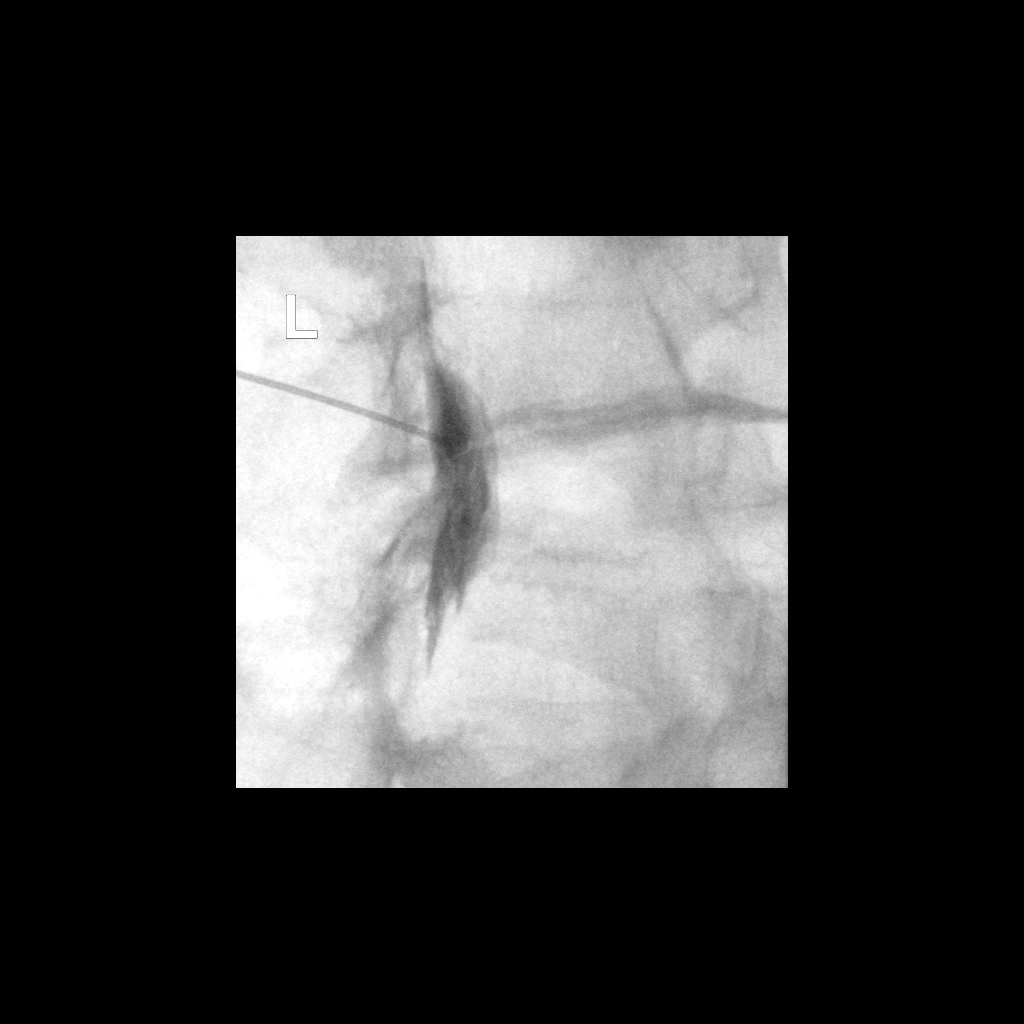

[2 of 2 positions shown; findings below may reference images not displayed]

EXAM:
LUMBAR EPIDURAL INJECTION:

DIAGNOSTIC EPIDURAL INJECTION:

THERAPEUTIC EPIDURAL INJECTION:

PROCEDURE:
The procedure, risks, benefits, and alternatives were explained to
the patient. Questions regarding the procedure were encouraged and
answered. The patient understands and consents to the procedure.

An interlaminar approach was performed on left at L4-5. The
overlying skin was cleansed and anesthetized. A 20 gauge epidural
needle was advanced using loss-of-resistance technique.

Injection of Isovue-M 200 shows a good epidural pattern with spread
above and below the level of needle placement, primarily on the left
no vascular opacification is seen.

120mg of Depo-Medrol mixed with 2ml lidocaine 1% were instilled. The
procedure was well-tolerated, and the patient was discharged thirty
minutes following the injection in good condition.

FLUOROSCOPY TIME:  13 seconds; 18 [CI] DAP

COMPLICATIONS:
None immediate
IMPRESSION: Technically successful epidural injection on the left at L4-5.

## 2017-04-20 MED ORDER — METHYLPREDNISOLONE ACETATE 40 MG/ML INJ SUSP (RADIOLOG
120.0000 mg | Freq: Once | INTRAMUSCULAR | Status: AC
  Administered 2017-04-20: 120 mg via EPIDURAL

## 2017-04-20 MED ORDER — IOPAMIDOL (ISOVUE-M 200) INJECTION 41%
1.0000 mL | Freq: Once | INTRAMUSCULAR | Status: AC
  Administered 2017-04-20: 1 mL via EPIDURAL

## 2017-04-20 NOTE — Discharge Instructions (Signed)

## 2017-04-27 ENCOUNTER — Other Ambulatory Visit: Payer: Medicare Other

## 2017-07-05 DIAGNOSIS — E7800 Pure hypercholesterolemia, unspecified: Secondary | ICD-10-CM | POA: Diagnosis not present

## 2017-07-05 DIAGNOSIS — Z1389 Encounter for screening for other disorder: Secondary | ICD-10-CM | POA: Diagnosis not present

## 2017-07-05 DIAGNOSIS — I25810 Atherosclerosis of coronary artery bypass graft(s) without angina pectoris: Secondary | ICD-10-CM | POA: Diagnosis not present

## 2017-07-05 DIAGNOSIS — I10 Essential (primary) hypertension: Secondary | ICD-10-CM | POA: Diagnosis not present

## 2017-07-05 DIAGNOSIS — Z6829 Body mass index (BMI) 29.0-29.9, adult: Secondary | ICD-10-CM | POA: Diagnosis not present

## 2017-07-05 DIAGNOSIS — N200 Calculus of kidney: Secondary | ICD-10-CM | POA: Diagnosis not present

## 2017-07-05 DIAGNOSIS — N400 Enlarged prostate without lower urinary tract symptoms: Secondary | ICD-10-CM | POA: Diagnosis not present

## 2017-07-05 DIAGNOSIS — Z0000 Encounter for general adult medical examination without abnormal findings: Secondary | ICD-10-CM | POA: Diagnosis not present

## 2017-07-05 DIAGNOSIS — H9319 Tinnitus, unspecified ear: Secondary | ICD-10-CM | POA: Diagnosis not present

## 2017-07-06 ENCOUNTER — Telehealth: Payer: Self-pay | Admitting: *Deleted

## 2017-07-06 NOTE — Telephone Encounter (Signed)
Referral sent to scheduling. 

## 2017-07-09 ENCOUNTER — Ambulatory Visit (INDEPENDENT_AMBULATORY_CARE_PROVIDER_SITE_OTHER): Payer: Medicare Other | Admitting: Internal Medicine

## 2017-07-09 VITALS — BP 136/78 | HR 51 | Ht 73.5 in | Wt 223.8 lb

## 2017-07-09 DIAGNOSIS — S2249XA Multiple fractures of ribs, unspecified side, initial encounter for closed fracture: Secondary | ICD-10-CM

## 2017-07-09 DIAGNOSIS — I2510 Atherosclerotic heart disease of native coronary artery without angina pectoris: Secondary | ICD-10-CM

## 2017-07-09 DIAGNOSIS — J302 Other seasonal allergic rhinitis: Secondary | ICD-10-CM

## 2017-07-09 DIAGNOSIS — I10 Essential (primary) hypertension: Secondary | ICD-10-CM

## 2017-07-09 DIAGNOSIS — I25810 Atherosclerosis of coronary artery bypass graft(s) without angina pectoris: Secondary | ICD-10-CM | POA: Diagnosis not present

## 2017-07-09 DIAGNOSIS — S2239XA Fracture of one rib, unspecified side, initial encounter for closed fracture: Secondary | ICD-10-CM | POA: Insufficient documentation

## 2017-07-09 DIAGNOSIS — L859 Epidermal thickening, unspecified: Secondary | ICD-10-CM | POA: Insufficient documentation

## 2017-07-09 DIAGNOSIS — H9319 Tinnitus, unspecified ear: Secondary | ICD-10-CM

## 2017-07-09 DIAGNOSIS — Z87442 Personal history of urinary calculi: Secondary | ICD-10-CM

## 2017-07-09 DIAGNOSIS — J9819 Other pulmonary collapse: Secondary | ICD-10-CM

## 2017-07-09 DIAGNOSIS — E782 Mixed hyperlipidemia: Secondary | ICD-10-CM

## 2017-07-09 DIAGNOSIS — I8390 Asymptomatic varicose veins of unspecified lower extremity: Secondary | ICD-10-CM | POA: Insufficient documentation

## 2017-07-09 HISTORY — DX: Other seasonal allergic rhinitis: J30.2

## 2017-07-09 HISTORY — DX: Essential (primary) hypertension: I10

## 2017-07-09 HISTORY — DX: Other pulmonary collapse: J98.19

## 2017-07-09 HISTORY — DX: Tinnitus, unspecified ear: H93.19

## 2017-07-09 HISTORY — DX: Atherosclerotic heart disease of native coronary artery without angina pectoris: I25.10

## 2017-07-09 HISTORY — DX: Multiple fractures of ribs, unspecified side, initial encounter for closed fracture: S22.49XA

## 2017-07-09 HISTORY — DX: Epidermal thickening, unspecified: L85.9

## 2017-07-09 HISTORY — DX: Asymptomatic varicose veins of unspecified lower extremity: I83.90

## 2017-07-09 HISTORY — DX: Personal history of urinary calculi: Z87.442

## 2017-07-09 NOTE — Patient Instructions (Signed)
Your physician recommends that you continue on your current medications as directed. Please refer to the Current Medication list given to you today. Your physician wants you to follow-up in: November, 2019 with Dr. Ross.  You will receive a reminder letter in the mail two months in advance. If you don't receive a letter, please call our office to schedule the follow-up appointment.  

## 2017-07-09 NOTE — Progress Notes (Signed)
Cardiology Office Note   Date:  07/09/2017   ID:  Michael Spencer, DOB [birth year], MRN [id number]  PCP:  Wenda Low, MD  Cardiologist:   Dorris Carnes, MD   Pt referred by Dr Lysle Rubens for continued care of CAD     History of Present Illness: Michael Spencer is a 72 y.o. male with a history of CAD (s/p CABG in 2012), HTN, HL, varicose veins)   In 2012 woke up   Arm stiff  Next day called doctor Sent to hosp  Catheterization done   Next day had CABG in [facility]  Followed there post op  Last seen one year ago  Doing well One time after had "blip" in beats  Then back to normal FHx of CAD    Moved to [place] in June 2018   He denies CP  Breathing OK  No dizziness  Active.    Current Meds  Medication Sig  . aspirin EC 81 MG tablet Take 162 mg daily by mouth.  Marland Kitchen atorvastatin (LIPITOR) 40 MG tablet Take 40 mg at bedtime by mouth.  . fluticasone (FLONASE) 50 MCG/ACT nasal spray Place 2 sprays daily as needed into both nostrils for allergies or rhinitis.  Marland Kitchen losartan (COZAAR) 25 MG tablet Take 25 mg daily by mouth.  . metoprolol tartrate (LOPRESSOR) 25 MG tablet Take 12.5 mg 2 (two) times daily by mouth.     Allergies:   Percocet [oxycodone-acetaminophen]   Past Medical History:  Diagnosis Date  . BPH (benign prostatic hyperplasia)   . Broken ribs 07/09/2017  . CAD (coronary artery disease) 07/09/2017  . Collapsed lung 07/09/2017   DUE TO SLIPPING ON ICE IN [facility] 2009  . Coronary artery disease   . History of kidney stones 07/09/2017  . 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 HTN (hypertension) 07/09/2017  . Seasonal allergies 07/09/2017  . Tinnitus 07/09/2017  . Varicose vein of leg 07/09/2017    Past Surgical History:  Procedure Laterality Date  . CORONARY ARTERY BYPASS GRAFT    . HERNIA REPAIR Right 2005  . NOSE SURGERY  1981  . TRANSURETHRAL RESECTION OF PROSTATE  1988  . VARICOSE VEIN SURGERY Left 2014     Social History:  The patient  reports that  has never smoked. he  has never used smokeless tobacco. He reports that he does not drink alcohol or use drugs.   Family History:  The patient's family history includes CAD in his brother, sister, and sister; Cancer in his father; Cancer - Other in his mother; Diabetes in his father; Hypertension in his father.    ROS:  Please see the history of present illness. All other systems are reviewed and  Negative to the above problem except as noted.    PHYSICAL EXAM: VS:  BP 136/78   Pulse (!) 51   Ht 6' 1.5" (1.867 m)   Wt 223 lb 12.8 oz (101.5 kg)   BMI 29.13 kg/m   GEN: Well nourished, well developed, in no acute distress  HEENT: normal  Neck: no JVD, carotid bruits, or masses Cardiac: RRR; no murmurs, rubs, or gallops,no edema  Respiratory:  clear to auscultation bilaterally, normal work of breathing GI: soft, nontender, nondistended, + BS  No hepatomegaly  MS: no deformity Moving all extremities   Skin: warm and dry, no rash Neuro:  Strength and sensation are intact Psych: euthymic mood, full affect   EKG:  EKG is ordered today.  SB 51 bpm  First degree AV  block  PR 210   Lipid Panel No results found for: CHOL, TRIG, HDL, CHOLHDL, VLDL, LDLCALC, LDLDIRECT    Wt Readings from Last 3 Encounters:  07/09/17 223 lb 12.8 oz (101.5 kg)      ASSESSMENT AND PLAN:  1  CAD    CABG in 2012  Has done well since  Will get records   Keep on same meds   Increase activy   2  HTN  BP is fair  Was 132 at [place] office  Follow   If starts to creep up would increase losartan    3  HL Lipisd are excellent  LDL 42  HDL 27   Continue statin    4  Obesity  Pt Spencer he has to lose wt     Will see if he can get into maintenance rehab as he did in [facility] Follow up in November    Current medicines are reviewed at length with the patient today.  The patient does not have concerns regarding medicines.  Signed, Dorris Carnes, MD  07/09/2017 11:59 AM    Michael Spencer,  [place], [place]  [address] Phone: [phone]; Fax: [phone]

## 2017-07-21 ENCOUNTER — Encounter: Payer: Self-pay | Admitting: Internal Medicine

## 2017-07-21 NOTE — Telephone Encounter (Signed)
I called Cardiac Rehab, spoke with Mechele Claude, RN.  She will give patient info to the person that takes care of maintenance program and will send appropriate paperwork to Dr Harrington Challenger to be signed.  At that point they can enroll patient.

## 2017-07-22 NOTE — Progress Notes (Signed)
Message sent to medical records for update on previous cardiac records.

## 2017-07-23 ENCOUNTER — Telehealth: Payer: Self-pay | Admitting: Internal Medicine

## 2017-07-23 NOTE — Telephone Encounter (Signed)
Records received from [place], [place] office. Gave to [place].  [phone]

## 2017-08-12 DIAGNOSIS — R400 Somnolence: Secondary | ICD-10-CM | POA: Diagnosis not present

## 2017-08-12 DIAGNOSIS — Z1389 Encounter for screening for other disorder: Secondary | ICD-10-CM | POA: Diagnosis not present

## 2017-08-12 DIAGNOSIS — G479 Sleep disorder, unspecified: Secondary | ICD-10-CM | POA: Diagnosis not present

## 2017-08-17 ENCOUNTER — Telehealth (HOSPITAL_COMMUNITY): Payer: Self-pay | Admitting: *Deleted

## 2017-08-20 ENCOUNTER — Encounter (HOSPITAL_BASED_OUTPATIENT_CLINIC_OR_DEPARTMENT_OTHER): Payer: Self-pay

## 2017-08-20 DIAGNOSIS — R5383 Other fatigue: Secondary | ICD-10-CM

## 2017-08-20 DIAGNOSIS — R0683 Snoring: Secondary | ICD-10-CM

## 2017-08-20 DIAGNOSIS — G4710 Hypersomnia, unspecified: Secondary | ICD-10-CM

## 2017-09-08 ENCOUNTER — Ambulatory Visit (HOSPITAL_BASED_OUTPATIENT_CLINIC_OR_DEPARTMENT_OTHER): Payer: Medicare Other | Attending: Internal Medicine | Admitting: Internal Medicine

## 2017-09-08 VITALS — Ht 73.0 in | Wt 216.0 lb

## 2017-09-08 DIAGNOSIS — R0683 Snoring: Secondary | ICD-10-CM

## 2017-09-08 DIAGNOSIS — G4733 Obstructive sleep apnea (adult) (pediatric): Secondary | ICD-10-CM | POA: Diagnosis not present

## 2017-09-08 DIAGNOSIS — G4710 Hypersomnia, unspecified: Secondary | ICD-10-CM | POA: Diagnosis present

## 2017-09-08 DIAGNOSIS — R5383 Other fatigue: Secondary | ICD-10-CM

## 2017-09-13 DIAGNOSIS — H2513 Age-related nuclear cataract, bilateral: Secondary | ICD-10-CM | POA: Diagnosis not present

## 2017-09-13 DIAGNOSIS — H43813 Vitreous degeneration, bilateral: Secondary | ICD-10-CM | POA: Diagnosis not present

## 2017-09-13 DIAGNOSIS — H40013 Open angle with borderline findings, low risk, bilateral: Secondary | ICD-10-CM | POA: Diagnosis not present

## 2017-09-18 NOTE — Procedures (Signed)
   NAME: Michael Spencer DATE OF BIRTH:  [birth year] MEDICAL RECORD NUMBER [id number]  LOCATION: [facility]  PHYSICIAN: Marius Ditch  DATE OF STUDY: 09/08/2017  SLEEP STUDY TYPE: Nocturnal Polysomnogram               REFERRING PHYSICIAN: Marius Ditch, MD  INDICATION FOR STUDY: excessive daytime sleepiness, snoring, falling asleep driving  EPWORTH SLEEPINESS SCORE:  NA HEIGHT: 6\' 1"  (185.4 cm)  WEIGHT: 216 lb (98 kg)    Body mass index is 28.5 kg/m.  NECK SIZE: 17.5 in.  MEDICATIONS  Patient self administered medications include: N/A. Medications administered during study include No sleep medicine administered. Medication list reviewed in eCW.  SLEEP STUDY TECHNIQUE  A multi-channel overnight Polysomnography study was performed. The channels recorded and monitored were central and occipital EEG, electrooculogram (EOG), submentalis EMG (chin), nasal and oral airflow, thoracic and abdominal wall motion, anterior tibialis EMG, snore microphone, electrocardiogram, and a pulse oximetry.   TECHNICAL COMMENTS  Comments added by Technician: Patient was restless all through the night. Patient had difficulty initiating sleep. Comments added by Scorer: N/A   SLEEP ARCHITECTURE  The study was initiated at 10:52:35 PM and terminated at 4:57:37 AM. The total recorded time was 365.0 minutes. EEG confirmed total sleep time was 177.0 minutes yielding a sleep efficiency of 48.5%%. Sleep onset after lights out was 10.5 minutes with a REM latency of 66.0 minutes. The patient spent 9.0%% of the night in stage N1 sleep, 68.4%% in stage N2 sleep, 0.0%% in stage N3 and 22.60% in REM. Wake after sleep onset (WASO) was 177.5 minutes. The Arousal Index was 29.8/hour.   RESPIRATORY PARAMETERS  There were a total of 46 respiratory disturbances out of which 20 were apneas ( 19 obstructive, 0 mixed, 1 central) and 26 hypopneas. The apnea/hypopnea index (AHI) was 15.6 events/hour. The  central sleep apnea index was 0.3 events/hour. The REM AHI was 39.0 events/hour and NREM AHI was 8.8 events/hour. The supine AHI was 43.6 events/hour and the non supine AHI was 14.69 supine during 3.11% of sleep. Overall RDI was 21/hr and REM RDI was 51/hr. Respiratory disturbances were associated with oxygen desaturation down to a nadir of 82.0% during sleep. The mean oxygen saturation during the study was 92.2%. The cumulative time under 88% oxygen saturation was 5.5 minutes.  LEG MOVEMENT DATA  The total leg movements were 93 with a resulting leg movement index of 31.5/hr . Associated arousal with leg movement index was 1.0/hr.   CARDIAC DATA  The underlying cardiac rhythm was most consistent with sinus rhythm. Mean heart rate during sleep was 55.1 bpm. Additional rhythm abnormalities include PVCs.   IMPRESSIONS  - Moderate Obstructive Sleep apnea(OSA)  - Many limb movements, but no significant periodic leg movements(PLMs) were present during sleep  DIAGNOSIS  - Obstructive Sleep Apnea (327.23 [G47.33 ICD-10])  RECOMMENDATIONS  - Auto adjusting CPAP or in lab CPAP titration recommended.   Marius Ditch Sleep specialist, [place] Board of Internal Medicine  ELECTRONICALLY SIGNED ON:  09/18/2017, 10:28 AM [place] PH: [phone]   FX: [phone] [place]

## 2017-09-20 DIAGNOSIS — Z1211 Encounter for screening for malignant neoplasm of colon: Secondary | ICD-10-CM | POA: Diagnosis not present

## 2017-09-20 DIAGNOSIS — K5730 Diverticulosis of large intestine without perforation or abscess without bleeding: Secondary | ICD-10-CM | POA: Diagnosis not present

## 2017-09-20 DIAGNOSIS — D126 Benign neoplasm of colon, unspecified: Secondary | ICD-10-CM | POA: Diagnosis not present

## 2017-09-23 DIAGNOSIS — D126 Benign neoplasm of colon, unspecified: Secondary | ICD-10-CM | POA: Diagnosis not present

## 2017-10-19 DIAGNOSIS — R05 Cough: Secondary | ICD-10-CM | POA: Diagnosis not present

## 2017-10-19 DIAGNOSIS — J309 Allergic rhinitis, unspecified: Secondary | ICD-10-CM | POA: Diagnosis not present

## 2018-01-06 DIAGNOSIS — G4733 Obstructive sleep apnea (adult) (pediatric): Secondary | ICD-10-CM | POA: Diagnosis not present

## 2018-01-06 DIAGNOSIS — I25810 Atherosclerosis of coronary artery bypass graft(s) without angina pectoris: Secondary | ICD-10-CM | POA: Diagnosis not present

## 2018-01-06 DIAGNOSIS — I10 Essential (primary) hypertension: Secondary | ICD-10-CM | POA: Diagnosis not present

## 2018-01-06 DIAGNOSIS — E7800 Pure hypercholesterolemia, unspecified: Secondary | ICD-10-CM | POA: Diagnosis not present

## 2018-04-11 ENCOUNTER — Ambulatory Visit (INDEPENDENT_AMBULATORY_CARE_PROVIDER_SITE_OTHER): Payer: Medicare Other | Admitting: Internal Medicine

## 2018-04-11 ENCOUNTER — Encounter: Payer: Self-pay | Admitting: Internal Medicine

## 2018-04-11 ENCOUNTER — Other Ambulatory Visit: Payer: Self-pay | Admitting: Internal Medicine

## 2018-04-11 VITALS — BP 142/80 | HR 62 | Ht 73.0 in | Wt 219.0 lb

## 2018-04-11 DIAGNOSIS — I6529 Occlusion and stenosis of unspecified carotid artery: Secondary | ICD-10-CM

## 2018-04-11 DIAGNOSIS — I10 Essential (primary) hypertension: Secondary | ICD-10-CM | POA: Diagnosis not present

## 2018-04-11 DIAGNOSIS — I25810 Atherosclerosis of coronary artery bypass graft(s) without angina pectoris: Secondary | ICD-10-CM

## 2018-04-11 DIAGNOSIS — E782 Mixed hyperlipidemia: Secondary | ICD-10-CM | POA: Diagnosis not present

## 2018-04-11 NOTE — Patient Instructions (Signed)
Medication Instructions:  No changes If you need a refill on your cardiac medications before your next appointment, please call your pharmacy.   Lab work: none If you have labs (blood work) drawn today and your tests are completely normal, you will receive your results only by: Marland Kitchen MyChart Message (if you have MyChart) OR . A paper copy in the mail If you have any lab test that is abnormal or we need to change your treatment, we will call you to review the results.  Testing/Procedures: Your physician has requested that you have a carotid duplex. This test is an ultrasound of the carotid arteries in your neck. It looks at blood flow through these arteries that supply the brain with blood. Allow one hour for this exam. There are no restrictions or special instructions.  Follow-Up: At [facility], you and your health needs are our priority.  As part of our continuing mission to provide you with exceptional heart care, we have created designated Provider Care Teams.  These Care Teams include your primary Cardiologist (physician) and Advanced Practice Providers (APPs -  Physician Assistants and Nurse Practitioners) who all work together to provide you with the care you need, when you need it. You will need a follow up appointment in:  12 months.  Please call our office 2 months in advance to schedule this appointment.  You may see Dorris Carnes, MD or one of the following Advanced Practice Providers on your designated Care Team: Richardson Dopp, PA-C [place], [place] . Daune Perch, NP  Any Other Special Instructions Will Be Listed Below (If Applicable).

## 2018-04-11 NOTE — Progress Notes (Addendum)
Cardiology Office Note   Date:  04/11/2018   ID:  Michael Spencer, DOB [birth year], MRN [id number]  PCP:  Wenda Low, MD  Cardiologist:   Dorris Carnes, MD   Pt presents for f/u of CAD     History of Present Illness: Michael Spencer is a 72 y.o. male with a history of CAD (s/p CABG in 2012), HTN, HL, varicose veins .  In 2012 woke up   Arm stiff  Next day called doctor Sent to hosp  Catheterization done   Next day had CABG in [facility] to [place] in 2018     I saw him for trhe first time in Feb 2019     Since I saw him  he has done well from a cardiac standpoint   No CP  No SOB  No dizzines    Having acute low back pain now    Current Meds  Medication Sig  . aspirin EC 81 MG tablet Take 81 mg by mouth daily.  Marland Kitchen atorvastatin (LIPITOR) 40 MG tablet Take 40 mg at bedtime by mouth.  . fluticasone (FLONASE) 50 MCG/ACT nasal spray Place 2 sprays daily as needed into both nostrils for allergies or rhinitis.  Marland Kitchen losartan (COZAAR) 25 MG tablet Take 25 mg daily by mouth.  . metoprolol tartrate (LOPRESSOR) 25 MG tablet Take 12.5 mg 2 (two) times daily by mouth.     Allergies:   Percocet [oxycodone-acetaminophen]   Past Medical History:  Diagnosis Date  . BPH (benign prostatic hyperplasia)   . Broken ribs 07/09/2017  . CAD (coronary artery disease) 07/09/2017  . Collapsed lung 07/09/2017   DUE TO SLIPPING ON ICE IN [facility] 2009  . Coronary artery disease   . History of kidney stones 07/09/2017  . 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 HTN (hypertension) 07/09/2017  . Seasonal allergies 07/09/2017  . Tinnitus 07/09/2017  . Varicose vein of leg 07/09/2017    Past Surgical History:  Procedure Laterality Date  . CORONARY ARTERY BYPASS GRAFT    . HERNIA REPAIR Right 2005  . NOSE SURGERY  1981  . TRANSURETHRAL RESECTION OF PROSTATE  1988  . VARICOSE VEIN SURGERY Left 2014     Social History:  The patient  reports that he has never smoked. He has never used smokeless tobacco.  He reports that he does not drink alcohol or use drugs.   Family History:  The patient's family history includes CAD in his brother, sister, and sister; Cancer in his father; Cancer - Other in his mother; Diabetes in his father; Hypertension in his father.    ROS:  Please see the history of present illness. All other systems are reviewed and  Negative to the above problem except as noted.    PHYSICAL EXAM: VS:  BP (!) 142/80   Pulse 62   Ht 6\' 1"  (1.854 m)   Wt 219 lb (99.3 kg)   SpO2 95%   BMI 28.89 kg/m   GEN: Well nourished, well developed, in no acute distress  HEENT: normal  Neck: JVP is normal   No carotid bruits, or masses Cardiac: RRR; no murmurs, rubs, or gallops.  No edema  Respiratory:  clear to auscultation bilaterally, normal work of breathing GI: soft, nontender, nondistended, + BS  No hepatomegaly  MS: no deformity Moving all extremities   Skin: warm and dry, no rash Neuro:  Strength and sensation are intact Psych: euthymic mood, full affect   EKG:  EKG is  not done     Lipid Panel No results found for: CHOL, TRIG, HDL, CHOLHDL, VLDL, LDLCALC, LDLDIRECT    Wt Readings from Last 3 Encounters:  04/11/18 219 lb (99.3 kg)  09/08/17 216 lb (98 kg)  07/09/17 223 lb 12.8 oz (101.5 kg)      ASSESSMENT AND PLAN:  1  CAD    CABG in 2012  Has done well since  Myovue in [place] nin 2018   No ischemia    Keep on same meds    2  HTN  BP is up a little   He is in a lot of pain with back   Keep on same meds   3  HL Lipisd are excellent  LDL 42  HDL 27   Continue statin  Follows with Husain    4  Obesity   Continues to watch wt  5 CV dz   Mild plaquing on USN in [place] again  F/U in 1 year     Will see if he can get into maintenance rehab as he did in [facility] Follow up in November    Current medicines are reviewed at length with the patient today.  The patient does not have concerns regarding medicines.  Signed, Dorris Carnes, MD  04/11/2018 11:40 AM     [address], [place],   [address] Phone: [phone]; Fax: [phone]

## 2018-04-12 ENCOUNTER — Ambulatory Visit (HOSPITAL_COMMUNITY)
Admission: RE | Admit: 2018-04-12 | Discharge: 2018-04-12 | Disposition: A | Discharge status: Home / Self Care | Payer: Medicare Other | Source: Ambulatory Visit | Attending: Internal Medicine | Admitting: Internal Medicine

## 2018-04-12 DIAGNOSIS — I6529 Occlusion and stenosis of unspecified carotid artery: Secondary | ICD-10-CM | POA: Insufficient documentation

## 2018-04-15 DIAGNOSIS — M25551 Pain in right hip: Secondary | ICD-10-CM | POA: Diagnosis not present

## 2018-04-15 DIAGNOSIS — M545 Low back pain: Secondary | ICD-10-CM | POA: Diagnosis not present

## 2018-04-15 DIAGNOSIS — M25552 Pain in left hip: Secondary | ICD-10-CM | POA: Diagnosis not present

## 2018-04-18 ENCOUNTER — Other Ambulatory Visit: Payer: Self-pay | Admitting: Sports Medicine

## 2018-04-18 ENCOUNTER — Other Ambulatory Visit: Payer: Medicare Other

## 2018-04-18 ENCOUNTER — Ambulatory Visit
Admission: RE | Admit: 2018-04-18 | Discharge: 2018-04-18 | Disposition: A | Discharge status: Home / Self Care | Payer: Medicare Other | Source: Ambulatory Visit | Attending: Sports Medicine | Admitting: Sports Medicine

## 2018-04-18 DIAGNOSIS — G8929 Other chronic pain: Secondary | ICD-10-CM

## 2018-04-18 DIAGNOSIS — M545 Low back pain, unspecified: Secondary | ICD-10-CM

## 2018-04-18 IMAGING — XA Imaging study
2 series · 2 of 2 positions shown · non-contrast
Comparison: none

CLINICAL DATA: Low back pain. RIGHT leg pain. No recent imaging.
Previous lumbar MRI performed [DATE] showed a rightward
predominant protrusion with osseous spurring at L2-L3. We chose this
level for the procedure today. Previously, the patient had LEFT leg
pain.

[Series 1: ortho standard · 1 of 1 slices shown (1 of 2)]
[im 1/1]
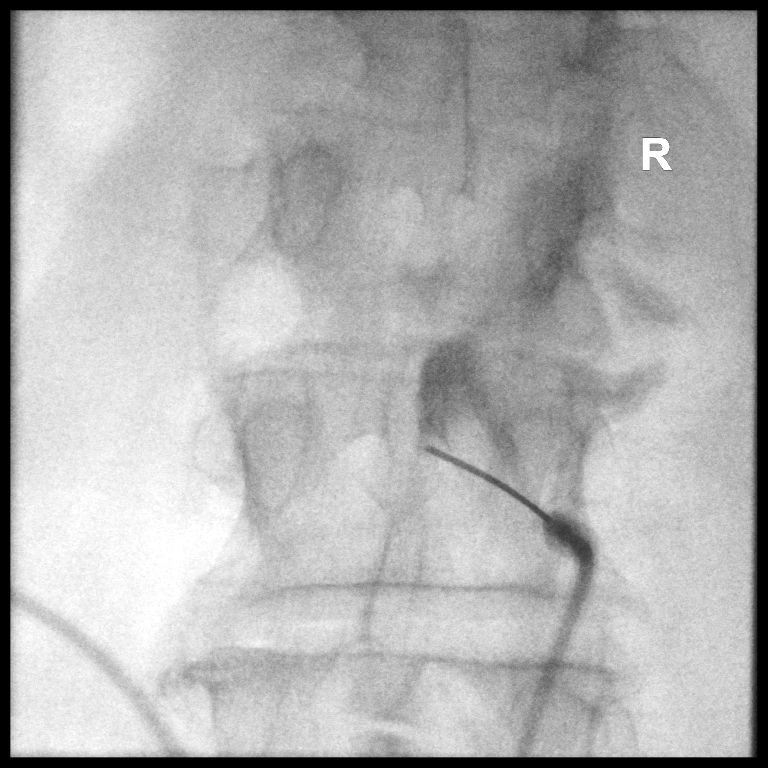

[Series 2: ortho standard · 1 of 1 slices shown (2 of 2)]
[im 1/1]
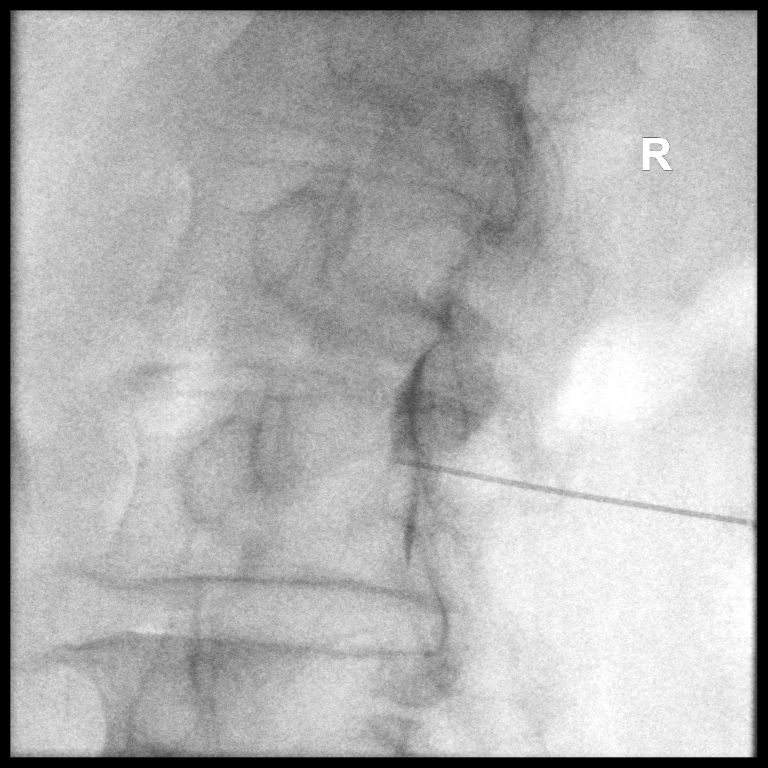

[2 of 2 positions shown; findings below may reference images not displayed]

FLUOROSCOPY TIME:  18 seconds corresponding to a Dose Area Product
of 53.9 Gy*m2

PROCEDURE:
The procedure, risks, benefits, and alternatives were explained to
the patient. Questions regarding the procedure were encouraged and
answered. The patient understands and consents to the procedure.

LUMBAR EPIDURAL INJECTION:

An interlaminar approach was performed on RIGHT at L2-L3. The
overlying skin was cleansed and anesthetized. A 20 gauge epidural
needle was advanced using loss-of-resistance technique.

DIAGNOSTIC EPIDURAL INJECTION:

Injection of Isovue-M 200 shows a good epidural pattern with spread
above and below the level of needle placement, primarily on the
RIGHT; no vascular opacification is seen.

THERAPEUTIC EPIDURAL INJECTION:

120.0 mg of Depo-Medrol mixed with 2 mL 1% lidocaine were instilled.
The procedure was well-tolerated, and the patient was discharged
thirty minutes following the injection in good condition.

The patient was discharged thirty minutes following the injection in
good condition.

COMPLICATIONS:
None
IMPRESSION: Technically successful epidural injection on the RIGHT L2-L3 # 1.

## 2018-04-18 MED ORDER — METHYLPREDNISOLONE ACETATE 40 MG/ML INJ SUSP (RADIOLOG: 120.0000 mg | Freq: Once | INTRAMUSCULAR | Status: DC

## 2018-04-18 MED ORDER — IOPAMIDOL (ISOVUE-M 200) INJECTION 41%: 1.0000 mL | Freq: Once | INTRAMUSCULAR | Status: DC

## 2018-04-18 NOTE — Discharge Instructions (Signed)

## 2018-05-13 ENCOUNTER — Other Ambulatory Visit: Payer: Self-pay | Admitting: Sports Medicine

## 2018-05-13 DIAGNOSIS — M545 Low back pain: Principal | ICD-10-CM

## 2018-05-13 DIAGNOSIS — G8929 Other chronic pain: Secondary | ICD-10-CM

## 2018-05-16 ENCOUNTER — Ambulatory Visit
Admission: RE | Admit: 2018-05-16 | Discharge: 2018-05-16 | Disposition: A | Discharge status: Home / Self Care | Payer: Medicare Other | Source: Ambulatory Visit | Attending: Sports Medicine | Admitting: Sports Medicine

## 2018-05-16 DIAGNOSIS — G8929 Other chronic pain: Secondary | ICD-10-CM

## 2018-05-16 DIAGNOSIS — M545 Low back pain, unspecified: Secondary | ICD-10-CM

## 2018-05-16 MED ORDER — IOPAMIDOL (ISOVUE-M 200) INJECTION 41%: 1.0000 mL | Freq: Once | INTRAMUSCULAR | Status: DC

## 2018-05-16 MED ORDER — METHYLPREDNISOLONE ACETATE 40 MG/ML INJ SUSP (RADIOLOG: 120.0000 mg | Freq: Once | INTRAMUSCULAR | Status: DC

## 2018-07-11 DIAGNOSIS — N400 Enlarged prostate without lower urinary tract symptoms: Secondary | ICD-10-CM | POA: Diagnosis not present

## 2018-07-11 DIAGNOSIS — I10 Essential (primary) hypertension: Secondary | ICD-10-CM | POA: Diagnosis not present

## 2018-07-11 DIAGNOSIS — G4733 Obstructive sleep apnea (adult) (pediatric): Secondary | ICD-10-CM | POA: Diagnosis not present

## 2018-07-11 DIAGNOSIS — Z0000 Encounter for general adult medical examination without abnormal findings: Secondary | ICD-10-CM | POA: Diagnosis not present

## 2018-07-11 DIAGNOSIS — L570 Actinic keratosis: Secondary | ICD-10-CM | POA: Diagnosis not present

## 2018-07-11 DIAGNOSIS — Z1389 Encounter for screening for other disorder: Secondary | ICD-10-CM | POA: Diagnosis not present

## 2018-07-11 DIAGNOSIS — E7800 Pure hypercholesterolemia, unspecified: Secondary | ICD-10-CM | POA: Diagnosis not present

## 2018-07-11 DIAGNOSIS — I25810 Atherosclerosis of coronary artery bypass graft(s) without angina pectoris: Secondary | ICD-10-CM | POA: Diagnosis not present

## 2019-01-25 DIAGNOSIS — G4733 Obstructive sleep apnea (adult) (pediatric): Secondary | ICD-10-CM | POA: Diagnosis not present

## 2019-01-25 DIAGNOSIS — E7800 Pure hypercholesterolemia, unspecified: Secondary | ICD-10-CM | POA: Diagnosis not present

## 2019-01-25 DIAGNOSIS — I10 Essential (primary) hypertension: Secondary | ICD-10-CM | POA: Diagnosis not present

## 2019-01-25 DIAGNOSIS — J309 Allergic rhinitis, unspecified: Secondary | ICD-10-CM | POA: Diagnosis not present

## 2019-01-25 DIAGNOSIS — I25810 Atherosclerosis of coronary artery bypass graft(s) without angina pectoris: Secondary | ICD-10-CM | POA: Diagnosis not present

## 2019-04-13 NOTE — Progress Notes (Signed)
Cardiology Office Note   Date:  04/14/2019   ID:  Michael Spencer, DOB [birth year], MRN [id number]  PCP:  Wenda Low, MD  Cardiologist:   Dorris Carnes, MD   Pt presents for f/u of CAD     History of Present Illness: Michael Spencer is a 73 y.o. male with a history of CAD (s/p CABG in 2012), HTN, HL, varicose veins .  In 2012 woke up   Arm stiff  Next day called doctor Sent to hosp  Catheterization done   Next day had CABG in [facility] to [place] in 2018     I saw him for trhe first time in Nov 2019    SInce seen he has done well  He had a couple spisdoes of CP with deep breath  Different from angina    None recently    He remains active     Current Meds  Medication Sig  . aspirin EC 81 MG tablet Take 81 mg by mouth daily.  Marland Kitchen atorvastatin (LIPITOR) 40 MG tablet Take 40 mg at bedtime by mouth.  . fluticasone (FLONASE) 50 MCG/ACT nasal spray Place 2 sprays daily as needed into both nostrils for allergies or rhinitis.  Marland Kitchen losartan (COZAAR) 25 MG tablet Take 25 mg daily by mouth.  . metoprolol tartrate (LOPRESSOR) 25 MG tablet Take 12.5 mg 2 (two) times daily by mouth.     Allergies:   Percocet [oxycodone-acetaminophen]   Past Medical History:  Diagnosis Date  . BPH (benign prostatic hyperplasia)   . Broken ribs 07/09/2017  . CAD (coronary artery disease) 07/09/2017  . Collapsed lung 07/09/2017   DUE TO SLIPPING ON ICE IN [facility] 2009  . Coronary artery disease   . History of kidney stones 07/09/2017  . 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 HTN (hypertension) 07/09/2017  . Seasonal allergies 07/09/2017  . Tinnitus 07/09/2017  . Varicose vein of leg 07/09/2017    Past Surgical History:  Procedure Laterality Date  . CORONARY ARTERY BYPASS GRAFT    . HERNIA REPAIR Right 2005  . NOSE SURGERY  1981  . TRANSURETHRAL RESECTION OF PROSTATE  1988  . VARICOSE VEIN SURGERY Left 2014     Social History:  The patient  reports that he has never smoked. He has never used  smokeless tobacco. He reports that he does not drink alcohol or use drugs.   Family History:  The patient's family history includes CAD in his brother, sister, and sister; Cancer in his father; Cancer - Other in his mother; Diabetes in his father; Hypertension in his father.    ROS:  Please see the history of present illness. All other systems are reviewed and  Negative to the above problem except as noted.    PHYSICAL EXAM: VS:  BP 140/88   Pulse (!) 58   Ht 6\' 1"  (1.854 m)   Wt 218 lb 6.4 oz (99.1 kg)   BMI 28.81 kg/m   GEN: Well nourished, well developed, in no acute distress  HEENT: normal  Neck: JVP is not elevated Cardiac: RRR; no murmurs, rubs, or gallops.  No edema  Respiratory:  clear to auscultation bilaterally, normal work of breathing GI: soft, nontender, nondistended, + BS  No hepatomegaly  MS: no deformity Moving all extremities   Skin: warm and dry, no rash Neuro:  Strength and sensation are intact Psych: euthymic mood, full affect   EKG:  EKG is not done     Lipid Panel  No results found for: CHOL, TRIG, HDL, CHOLHDL, VLDL, LDLCALC, LDLDIRECT    Wt Readings from Last 3 Encounters:  04/14/19 218 lb 6.4 oz (99.1 kg)  04/11/18 219 lb (99.3 kg)  09/08/17 216 lb (98 kg)      ASSESSMENT AND PLAN:  1  CAD    CABG in 2012  Has done well since  Myovue done when he lived in [place]  in 2018   No ischemia    Keep on same meds  Hix dpidsoes of pain were atypical    2  HTN  BP is mildly increased    Would follow    3  HL Lipids drawn at Dr Sherilyn Cooter office LDL 50  HDL 36  Keep on Lipitor   4  Obesity   Continues to watch wt  5 CV dz   Mild plaquing on USN in [place] again  F/U in 1 year       Current medicines are reviewed at length with the patient today.  The patient does not have concerns regarding medicines.  Signed, Dorris Carnes, MD  04/14/2019 3:02 PM    [facility] [place], [place], [place]  [address] Phone: [phone]; Fax: [phone]

## 2019-04-14 ENCOUNTER — Other Ambulatory Visit: Payer: Self-pay

## 2019-04-14 ENCOUNTER — Encounter: Payer: Self-pay | Admitting: Internal Medicine

## 2019-04-14 ENCOUNTER — Ambulatory Visit (INDEPENDENT_AMBULATORY_CARE_PROVIDER_SITE_OTHER): Payer: Medicare Other | Admitting: Internal Medicine

## 2019-04-14 VITALS — BP 140/88 | HR 58 | Ht 73.0 in | Wt 218.4 lb

## 2019-04-14 DIAGNOSIS — I25810 Atherosclerosis of coronary artery bypass graft(s) without angina pectoris: Secondary | ICD-10-CM | POA: Diagnosis not present

## 2019-04-14 NOTE — Patient Instructions (Signed)
Medication Instructions:  No changes *If you need a refill on your cardiac medications before your next appointment, please call your pharmacy*  Lab Work: none If you have labs (blood work) drawn today and your tests are completely normal, you will receive your results only by: Marland Kitchen MyChart Message (if you have MyChart) OR . A paper copy in the mail If you have any lab test that is abnormal or we need to change your treatment, we will call you to review the results.  Testing/Procedures: none  Follow-Up: At [facility], you and your health needs are our priority.  As part of our continuing mission to provide you with exceptional heart care, we have created designated Provider Care Teams.  These Care Teams include your primary Cardiologist (physician) and Advanced Practice Providers (APPs -  Physician Assistants and Nurse Practitioners) who all work together to provide you with the care you need, when you need it.  Your next appointment:   12 months--next December 2021  The format for your next appointment:   In Person  Provider:   Dorris Carnes, MD  Other Instructions

## 2019-05-01 ENCOUNTER — Other Ambulatory Visit: Payer: Self-pay

## 2019-05-01 DIAGNOSIS — Z20828 Contact with and (suspected) exposure to other viral communicable diseases: Secondary | ICD-10-CM | POA: Diagnosis not present

## 2019-05-01 DIAGNOSIS — Z20822 Contact with and (suspected) exposure to covid-19: Secondary | ICD-10-CM

## 2019-05-02 LAB — NOVEL CORONAVIRUS, NAA: SARS-CoV-2, NAA: NOT DETECTED

## 2019-07-17 DIAGNOSIS — Z23 Encounter for immunization: Secondary | ICD-10-CM | POA: Diagnosis not present

## 2019-07-17 DIAGNOSIS — E7800 Pure hypercholesterolemia, unspecified: Secondary | ICD-10-CM | POA: Diagnosis not present

## 2019-07-17 DIAGNOSIS — N400 Enlarged prostate without lower urinary tract symptoms: Secondary | ICD-10-CM | POA: Diagnosis not present

## 2019-07-17 DIAGNOSIS — I25810 Atherosclerosis of coronary artery bypass graft(s) without angina pectoris: Secondary | ICD-10-CM | POA: Diagnosis not present

## 2019-07-17 DIAGNOSIS — G4733 Obstructive sleep apnea (adult) (pediatric): Secondary | ICD-10-CM | POA: Diagnosis not present

## 2019-07-17 DIAGNOSIS — Z1389 Encounter for screening for other disorder: Secondary | ICD-10-CM | POA: Diagnosis not present

## 2019-07-17 DIAGNOSIS — J309 Allergic rhinitis, unspecified: Secondary | ICD-10-CM | POA: Diagnosis not present

## 2019-07-17 DIAGNOSIS — I10 Essential (primary) hypertension: Secondary | ICD-10-CM | POA: Diagnosis not present

## 2019-07-17 DIAGNOSIS — Z0000 Encounter for general adult medical examination without abnormal findings: Secondary | ICD-10-CM | POA: Diagnosis not present

## 2019-10-10 DIAGNOSIS — B370 Candidal stomatitis: Secondary | ICD-10-CM | POA: Diagnosis not present

## 2020-01-17 DIAGNOSIS — J309 Allergic rhinitis, unspecified: Secondary | ICD-10-CM | POA: Diagnosis not present

## 2020-01-17 DIAGNOSIS — I10 Essential (primary) hypertension: Secondary | ICD-10-CM | POA: Diagnosis not present

## 2020-01-17 DIAGNOSIS — I25810 Atherosclerosis of coronary artery bypass graft(s) without angina pectoris: Secondary | ICD-10-CM | POA: Diagnosis not present

## 2020-01-17 DIAGNOSIS — G4733 Obstructive sleep apnea (adult) (pediatric): Secondary | ICD-10-CM | POA: Diagnosis not present

## 2020-01-17 DIAGNOSIS — Z23 Encounter for immunization: Secondary | ICD-10-CM | POA: Diagnosis not present

## 2020-01-17 DIAGNOSIS — E7800 Pure hypercholesterolemia, unspecified: Secondary | ICD-10-CM | POA: Diagnosis not present

## 2020-01-17 DIAGNOSIS — N400 Enlarged prostate without lower urinary tract symptoms: Secondary | ICD-10-CM | POA: Diagnosis not present

## 2020-04-15 ENCOUNTER — Other Ambulatory Visit: Payer: Self-pay

## 2020-04-15 ENCOUNTER — Ambulatory Visit (INDEPENDENT_AMBULATORY_CARE_PROVIDER_SITE_OTHER): Payer: Medicare Other | Admitting: Internal Medicine

## 2020-04-15 ENCOUNTER — Encounter: Payer: Self-pay | Admitting: Internal Medicine

## 2020-04-15 VITALS — BP 158/100 | HR 66 | Ht 73.75 in | Wt 216.0 lb

## 2020-04-15 DIAGNOSIS — I25810 Atherosclerosis of coronary artery bypass graft(s) without angina pectoris: Secondary | ICD-10-CM

## 2020-04-15 NOTE — Patient Instructions (Signed)
Medication Instructions:  No changes *If you need a refill on your cardiac medications before your next appointment, please call your pharmacy*   Lab Work: none If you have labs (blood work) drawn today and your tests are completely normal, you will receive your results only by: . MyChart Message (if you have MyChart) OR . A paper copy in the mail If you have any lab test that is abnormal or we need to change your treatment, we will call you to review the results.   Testing/Procedures: none   Follow-Up: At [facility], you and your health needs are our priority.  As part of our continuing mission to provide you with exceptional heart care, we have created designated Provider Care Teams.  These Care Teams include your primary Cardiologist (physician) and Advanced Practice Providers (APPs -  Physician Assistants and Nurse Practitioners) who all work together to provide you with the care you need, when you need it.   Your next appointment:   12 month(s)  The format for your next appointment:   In Person  Provider:   You may see Paula Ross, MD or one of the following Advanced Practice Providers on your designated Care Team:    Scott Weaver, PA-C  Vin Bhagat, PA-C   Other Instructions   

## 2020-04-15 NOTE — Progress Notes (Signed)
Cardiology Office Note   Date:  04/15/2020   ID:  Michael Spencer, DOB May 07, 1946, MRN [id number]  PCP:  Wenda Low, MD  Cardiologist:   Dorris Carnes, MD   Pt presents for f/u of CAD     History of Present Illness: Michael Spencer is a 74 y.o. male with a history of CAD (s/p CABG in 2012), HTN, HL, varicose veins . In 2012 he woke up   Arm stiff  Next day called doctor Sent to hosp  Catheterization done   Next day had CABG in [facility]  The pt moved to [place] in 2018    I saw him for trhe first time in Nov 2019    Since the patient was in clinic he has done well.  His breathing has been good.  He denies chest pain.  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  He says his blood pressure is usually good in the 120s.  When he does things it increases.  Current Meds  Medication Sig  . aspirin EC 81 MG tablet Take 81 mg by mouth daily.  Marland Kitchen atorvastatin (LIPITOR) 40 MG tablet Take 40 mg at bedtime by mouth.  . losartan (COZAAR) 25 MG tablet Take 25 mg daily by mouth.  . metoprolol tartrate (LOPRESSOR) 25 MG tablet Take 12.5 mg 2 (two) times daily by mouth.     Allergies:   Percocet [oxycodone-acetaminophen]   Past Medical History:  Diagnosis Date  . BPH (benign prostatic hyperplasia)   . Broken ribs 07/09/2017  . CAD (coronary artery disease) 07/09/2017  . Collapsed lung 07/09/2017   DUE TO SLIPPING ON ICE IN [facility] 2009  . Coronary artery disease   . History of kidney stones 07/09/2017  . 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 HTN (hypertension) 07/09/2017  . Seasonal allergies 07/09/2017  . Tinnitus 07/09/2017  . Varicose vein of leg 07/09/2017    Past Surgical History:  Procedure Laterality Date  . CORONARY ARTERY BYPASS GRAFT    . HERNIA REPAIR Right 2005  . NOSE SURGERY  1981  . TRANSURETHRAL RESECTION OF PROSTATE  1988  . VARICOSE VEIN SURGERY Left 2014     Social History:  The patient  reports that he has never smoked. He has never used smokeless tobacco. He  reports that he does not drink alcohol and does not use drugs.   Family History:  The patient's family history includes CAD in his brother, sister, and sister; Cancer in his father; Cancer - Other in his mother; Diabetes in his father; Hypertension in his father.    ROS:  Please see the history of present illness. All other systems are reviewed and  Negative to the above problem except as noted.    PHYSICAL EXAM: VS:  BP (!) 158/100   Pulse 66   Ht 6' 1.75" (1.873 m)   Wt 216 lb (98 kg)   SpO2 93%   BMI 27.92 kg/m   BP on my check 130/84     GEN: Well nourished, well developed, in no acute distress  HEENT: normal  Neck: JVP is not elevated Cardiac: RRR; no murmurs.  No edema  Respiratory:  clear to auscultation bilaterally,  GI: soft, nontender, nondistended, + BS  No hepatomegaly  MS: no deformity Moving all extremities   Skin: warm and dry, no rash Neuro:  Strength and sensation are intact Psych: euthymic mood, full affect   EKG:  EKG shows SR 66 bpm  Lipid Panel No results found for: CHOL, TRIG, HDL, CHOLHDL, VLDL, LDLCALC, LDLDIRECT    Wt Readings from Last 3 Encounters:  04/15/20 216 lb (98 kg)  04/14/19 218 lb 6.4 oz (99.1 kg)  04/11/18 219 lb (99.3 kg)      ASSESSMENT AND PLAN:  1  CAD    CABG in 2012  Has done well since  Myovue done when he lived in [place]  in 2018   No ischemia   the patient is without symptoms of angina.  I would keep on same medicines.  2  HTN  BP is mildly increased on arrival but drops when I check it on my own.  Would keep on same regimen asked him to follow 3  HL Lipids drawn at Dr Hussain's office LDL 43  HDL 37  Keep on Lipitor   4  Obesity   Continues to watch wt  5 CV dz   Mild plaquing in 2019  Had been stable    F/U in 1 year       Current medicines are reviewed at length with the patient today.  The patient does not have concerns regarding medicines.  Signed, Dorris Carnes, MD  04/15/2020 3:14 PM    [place], [place], [place]  [address] Phone: [phone]; Fax: [phone]

## 2020-05-08 DIAGNOSIS — H04123 Dry eye syndrome of bilateral lacrimal glands: Secondary | ICD-10-CM | POA: Diagnosis not present

## 2020-05-08 DIAGNOSIS — H43813 Vitreous degeneration, bilateral: Secondary | ICD-10-CM | POA: Diagnosis not present

## 2020-05-08 DIAGNOSIS — H40013 Open angle with borderline findings, low risk, bilateral: Secondary | ICD-10-CM | POA: Diagnosis not present

## 2020-05-08 DIAGNOSIS — H2513 Age-related nuclear cataract, bilateral: Secondary | ICD-10-CM | POA: Diagnosis not present

## 2020-05-24 DIAGNOSIS — Z20822 Contact with and (suspected) exposure to covid-19: Secondary | ICD-10-CM | POA: Diagnosis not present

## 2020-05-24 DIAGNOSIS — Z03818 Encounter for observation for suspected exposure to other biological agents ruled out: Secondary | ICD-10-CM | POA: Diagnosis not present

## 2020-07-15 ENCOUNTER — Other Ambulatory Visit: Payer: Medicare Other

## 2020-07-17 DIAGNOSIS — G4733 Obstructive sleep apnea (adult) (pediatric): Secondary | ICD-10-CM | POA: Diagnosis not present

## 2020-07-17 DIAGNOSIS — I2510 Atherosclerotic heart disease of native coronary artery without angina pectoris: Secondary | ICD-10-CM | POA: Diagnosis not present

## 2020-07-17 DIAGNOSIS — J309 Allergic rhinitis, unspecified: Secondary | ICD-10-CM | POA: Diagnosis not present

## 2020-07-17 DIAGNOSIS — I10 Essential (primary) hypertension: Secondary | ICD-10-CM | POA: Diagnosis not present

## 2020-07-17 DIAGNOSIS — E611 Iron deficiency: Secondary | ICD-10-CM | POA: Diagnosis not present

## 2020-07-17 DIAGNOSIS — N400 Enlarged prostate without lower urinary tract symptoms: Secondary | ICD-10-CM | POA: Diagnosis not present

## 2020-07-17 DIAGNOSIS — N200 Calculus of kidney: Secondary | ICD-10-CM | POA: Diagnosis not present

## 2020-07-17 DIAGNOSIS — Z1389 Encounter for screening for other disorder: Secondary | ICD-10-CM | POA: Diagnosis not present

## 2020-07-17 DIAGNOSIS — E7800 Pure hypercholesterolemia, unspecified: Secondary | ICD-10-CM | POA: Diagnosis not present

## 2020-07-17 DIAGNOSIS — K143 Hypertrophy of tongue papillae: Secondary | ICD-10-CM | POA: Diagnosis not present

## 2020-07-17 DIAGNOSIS — Z0000 Encounter for general adult medical examination without abnormal findings: Secondary | ICD-10-CM | POA: Diagnosis not present

## 2020-07-17 DIAGNOSIS — H9319 Tinnitus, unspecified ear: Secondary | ICD-10-CM | POA: Diagnosis not present

## 2020-08-23 DIAGNOSIS — E611 Iron deficiency: Secondary | ICD-10-CM | POA: Diagnosis not present

## 2020-09-17 DIAGNOSIS — K141 Geographic tongue: Secondary | ICD-10-CM | POA: Diagnosis not present

## 2021-01-15 DIAGNOSIS — I10 Essential (primary) hypertension: Secondary | ICD-10-CM | POA: Diagnosis not present

## 2021-01-15 DIAGNOSIS — G4733 Obstructive sleep apnea (adult) (pediatric): Secondary | ICD-10-CM | POA: Diagnosis not present

## 2021-01-15 DIAGNOSIS — E7800 Pure hypercholesterolemia, unspecified: Secondary | ICD-10-CM | POA: Diagnosis not present

## 2021-01-15 DIAGNOSIS — H6123 Impacted cerumen, bilateral: Secondary | ICD-10-CM | POA: Diagnosis not present

## 2021-01-15 DIAGNOSIS — I25810 Atherosclerosis of coronary artery bypass graft(s) without angina pectoris: Secondary | ICD-10-CM | POA: Diagnosis not present

## 2021-03-04 DIAGNOSIS — H903 Sensorineural hearing loss, bilateral: Secondary | ICD-10-CM | POA: Diagnosis not present

## 2021-03-04 DIAGNOSIS — H9313 Tinnitus, bilateral: Secondary | ICD-10-CM | POA: Diagnosis not present

## 2021-04-16 DIAGNOSIS — I10 Essential (primary) hypertension: Secondary | ICD-10-CM | POA: Diagnosis not present

## 2021-04-16 DIAGNOSIS — R4781 Slurred speech: Secondary | ICD-10-CM | POA: Diagnosis not present

## 2021-04-17 ENCOUNTER — Other Ambulatory Visit: Payer: Self-pay | Admitting: Internal Medicine

## 2021-04-17 DIAGNOSIS — R4781 Slurred speech: Secondary | ICD-10-CM

## 2021-05-06 NOTE — Progress Notes (Signed)
Cardiology Office Note   Date:  05/07/2021   ID:  Michael Spencer, DOB 04-02-1946, MRN [id number]  PCP:  Wenda Low, MD  Cardiologist:   Dorris Carnes, MD   Pt presents for f/u of CAD     History of Present Illness: Michael Spencer is a 75 y.o. male with a history of CAD (s/p CABG in 2012), HTN, HL, varicose veins . In 2012 he woke up   Arm stiff  Next day called doctor Sent to hosp  Catheterization done   Next day had CABG [facility] ) The pt moved to [place] in 2018   I last saw him in 2021  Since seen he has done OK from a cardiac standpoint   No CP   Breathing is OK  No dizziness   BP at home 109-120s  /  He says it is higher in doctor's office (white coat effect) Diet:   Br   7 AM   Life cereal   Lunch:   Sandwich Dinner:   5 or 6 PM   Meat and salad   Drinks:   Water   One or 2 Dr Samson Frederic per week     Since the patient was in clinic he has done well.  His breathing has been good.  He denies chest pain.  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  He says his blood pressure is usually good in the 120s.  When he does things it increases.  Wife says he is going to have an MRI for some slurring of speech.   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 He  has never used smokeless tobacco. He reports that he does not drink alcohol and does not use drugs.   Family History:  The patient's family history includes CAD in his brother, sister, and sister; Cancer in his father; Cancer - Other in his mother; Diabetes in his father; Hypertension in his father.    ROS:  Please see the history of present illness. All other systems are reviewed and  Negative to the above problem except as noted.    PHYSICAL EXAM: VS:  BP (!) 150/70   Pulse 61   Ht 6' 1.5" (1.867 m)   Wt 213 lb 12.8 oz (97 kg)   SpO2 97%   BMI 27.83 kg/m   BP on my check 130/84     GEN: Well nourished, well developed, in no acute distress  HEENT: normal  Neck: JVP is not elevated Cardiac: RRR; no murmurs.  No edema  Respiratory:  clear to auscultation bilaterally,  GI: soft, nontender, nondistended, + BS  No hepatomegaly  MS: no deformity Moving  all extremities   Skin: warm and dry, no rash Neuro:  Strength and sensation are intact Psych: euthymic mood, full affect   EKG:  EKG shows SR 71 bpm   Occasional PVC    Lipid Panel No results found for: CHOL, TRIG, HDL, CHOLHDL, VLDL, LDLCALC, LDLDIRECT    Wt Readings from Last 3 Encounters:  05/07/21 213 lb 12.8 oz (97 kg)  04/15/20 216 lb (98 kg)  04/14/19 218 lb 6.4 oz (99.1 kg)      ASSESSMENT AND PLAN:  1  CAD    CABG in 2012  Has done well since  Myovue done when he lived in [place]  in 2018   No ischemia   Patient remains asymtpomatic     2  HTN  BP is elevated   He says white coat.  REcomm he bring cuff to next MD visit to confirm  3  HL   LDL 42  HDL 39    4  Obesity   Discussed diet    5 CV dz   Mild plaquing in 2019  Had been stable    F/U in 1 year       Current medicines are reviewed at length with the patient today.  The patient does not have concerns regarding medicines.  Signed, Dorris Carnes, MD  05/07/2021 3:56 PM    [address] [place], [place], [place]   [address] Phone: [phone]; Fax: [phone]

## 2021-05-07 ENCOUNTER — Other Ambulatory Visit: Payer: Self-pay

## 2021-05-07 ENCOUNTER — Ambulatory Visit (INDEPENDENT_AMBULATORY_CARE_PROVIDER_SITE_OTHER): Payer: Medicare Other | Admitting: Internal Medicine

## 2021-05-07 ENCOUNTER — Encounter: Payer: Self-pay | Admitting: Internal Medicine

## 2021-05-07 ENCOUNTER — Ambulatory Visit: Payer: Medicare Other | Admitting: Internal Medicine

## 2021-05-07 VITALS — BP 150/70 | HR 61 | Ht 73.5 in | Wt 213.8 lb

## 2021-05-07 DIAGNOSIS — I2510 Atherosclerotic heart disease of native coronary artery without angina pectoris: Secondary | ICD-10-CM

## 2021-05-07 NOTE — Patient Instructions (Signed)
Medication Instructions:  Your physician recommends that you continue on your current medications as directed. Please refer to the Current Medication list given to you today.  *If you need a refill on your cardiac medications before your next appointment, please call your pharmacy*   Lab Work: none If you have labs (blood work) drawn today and your tests are completely normal, you will receive your results only by: [place] (if you have MyChart) OR A paper copy in the mail If you have any lab test that is abnormal or we need to change your treatment, we will call you to review the results.   Testing/Procedures: none   Follow-Up: At [facility], you and your health needs are our priority.  As part of our continuing mission to provide you with exceptional heart care, we have created designated Provider Care Teams.  These Care Teams include your primary Cardiologist (physician) and Advanced Practice Providers (APPs -  Physician Assistants and Nurse Practitioners) who all work together to provide you with the care you need, when you need it.  We recommend signing up for the patient portal called "MyChart".  Sign up information is provided on this After Visit Summary.  MyChart is used to connect with patients for Virtual Visits (Telemedicine).  Patients are able to view lab/test results, encounter notes, upcoming appointments, etc.  Non-urgent messages can be sent to your provider as well.   To learn more about what you can do with MyChart, go to [email or web address].    Your next appointment:   10 month(s)  The format for your next appointment:   In Person  Provider:   Dorris Carnes, MD     Other Instructions

## 2021-05-08 ENCOUNTER — Ambulatory Visit: Payer: Medicare Other | Admitting: Internal Medicine

## 2021-05-09 ENCOUNTER — Ambulatory Visit
Admission: RE | Admit: 2021-05-09 | Discharge: 2021-05-09 | Disposition: A | Discharge status: Home / Self Care | Payer: Medicare Other | Source: Ambulatory Visit | Attending: Internal Medicine | Admitting: Internal Medicine

## 2021-05-09 DIAGNOSIS — R4781 Slurred speech: Secondary | ICD-10-CM | POA: Diagnosis not present

## 2021-05-09 IMAGING — MR MR HEAD WO/W CM
13 series · 48 of 48 positions shown · IV contrast (multihance)
Comparison: None.

CLINICAL DATA: 75-year-old male with slurred speech for 1 month.

EXAM:
MRI HEAD WITHOUT AND WITH CONTRAST
TECHNIQUE: Multiplanar, multiecho pulse sequences of the brain and surrounding
structures were obtained without and with intravenous contrast.
CONTRAST:  19mL MULTIHANCE GADOBENATE DIMEGLUMINE 529 MG/ML IV SOLN

[Series 2: T1 · sagittal · 5.0mm · 0.45mm/px · 2 of 22 slices shown (1 of 2)]
[im 1/22]
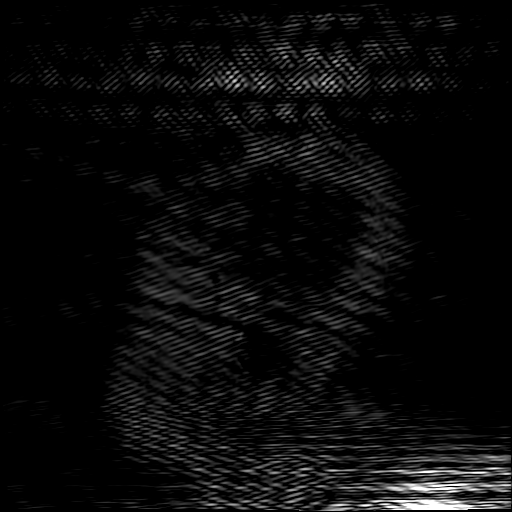
[im 22/22]
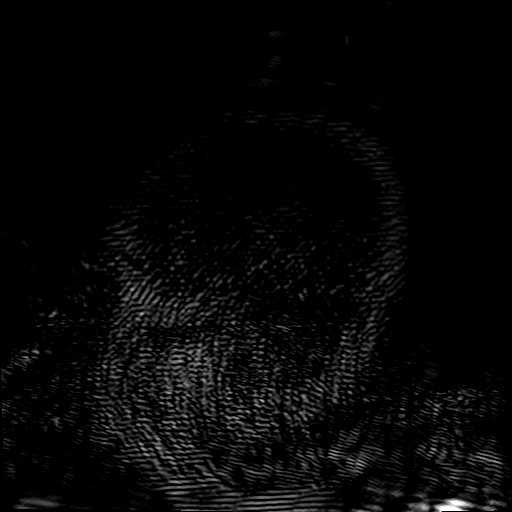

[Series 3: T1 · sagittal · 5.0mm · 0.45mm/px · 2 of 22 slices shown (2 of 2)]
[im 1/22]
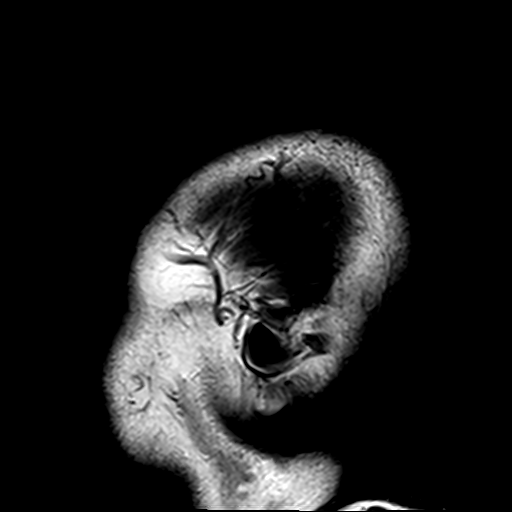
[im 22/22]
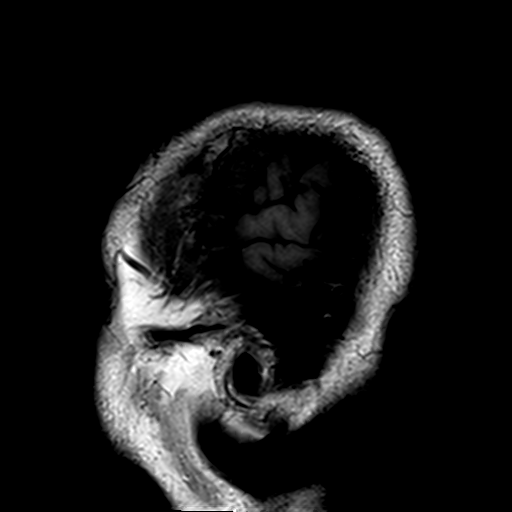

[Series 4: DWI · axial · 3.0mm · 1.80mm/px · z∈[-28,+119]mm · 7 of 100 slices shown (1 of 4)]
[im 1/100]
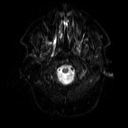
[im 17/100]
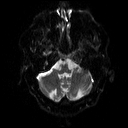
[im 34/100]
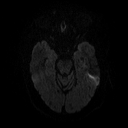
[im 50/100]
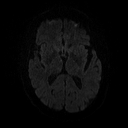
[im 67/100]
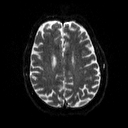
[im 83/100]
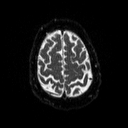
[im 100/100]
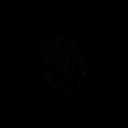

[Series 5: DWI · axial · 3.0mm · 1.80mm/px · z∈[-28,+119]mm · 3 of 46 slices shown (2 of 4)]
[im 1/46]
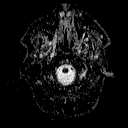
[im 23/46]
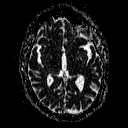
[im 46/46]
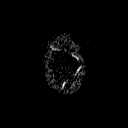

[Series 6: DWI · coronal · 5.0mm · 1.80mm/px · 5 of 71 slices shown (3 of 4)]
[im 1/71]
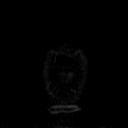
[im 18/71]
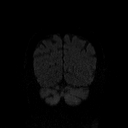
[im 36/71]
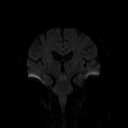
[im 53/71]
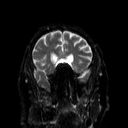
[im 71/71]
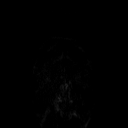

[Series 7: DWI · coronal · 5.0mm · 1.80mm/px · 2 of 36 slices shown (4 of 4)]
[im 1/36]
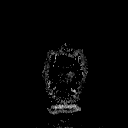
[im 36/36]
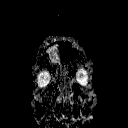

[Series 8: T2 · axial · 5.0mm · 0.60mm/px · 1 of 22 slices shown (1 of 2)]
[im 1/22]
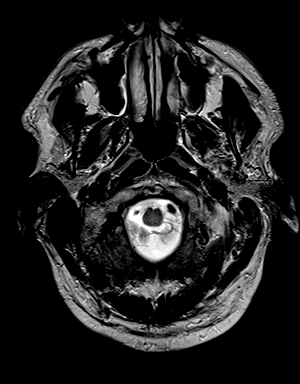

[Series 9: FLAIR · axial · 3.0mm · 0.45mm/px · z∈[-21,+113]mm · 2 of 30 slices shown]
[im 1/30]
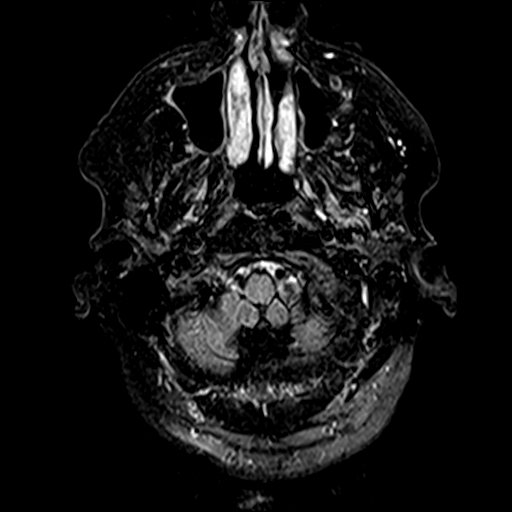
[im 30/30]
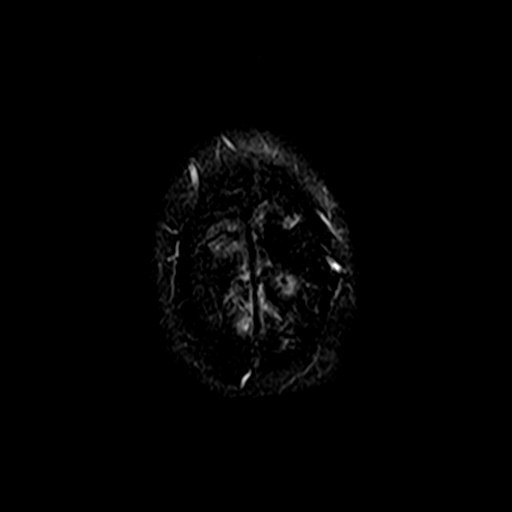

[Series 11: swi_images · axial · 4.0mm · 0.90mm/px · z∈[-24,+115]mm · 2 of 36 slices shown]
[im 1/36]
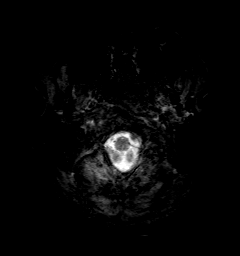
[im 36/36]
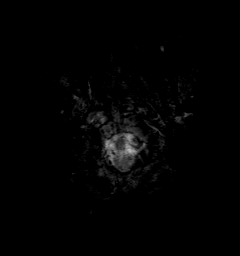

[Series 12: t1_mpr_tra · axial · 1.0mm · 0.75mm/px · z∈[-31,+114]mm · 9 of 144 slices shown]
[im 1/144]
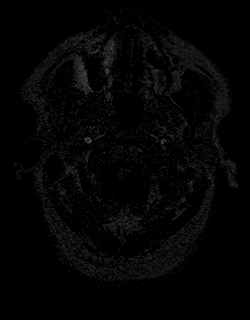
[im 18/144]
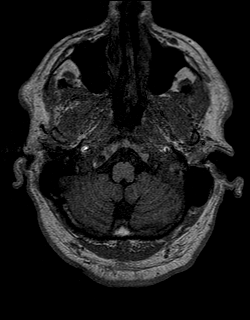
[im 36/144]
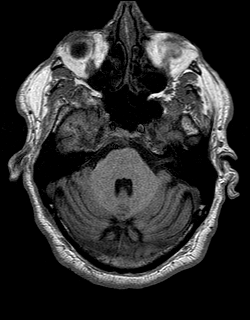
[im 54/144]
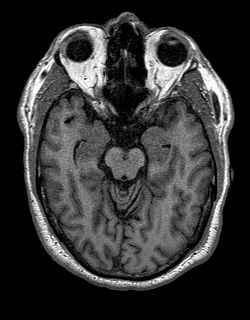
[im 72/144]
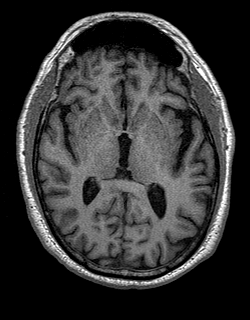
[im 90/144]
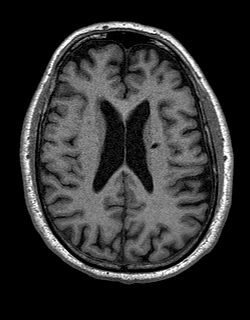
[im 108/144]
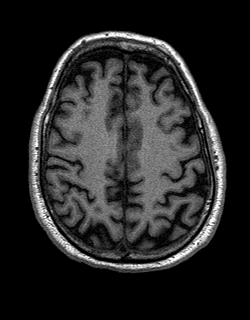
[im 126/144]
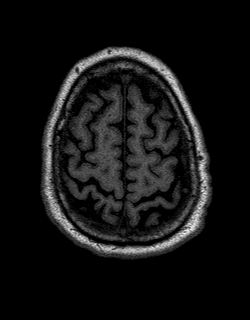
[im 144/144]
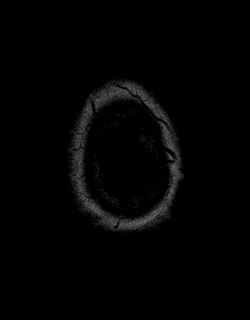

[Series 13: T2 · coronal · 5.0mm · 0.45mm/px · 2 of 28 slices shown (2 of 2)]
[im 1/28]
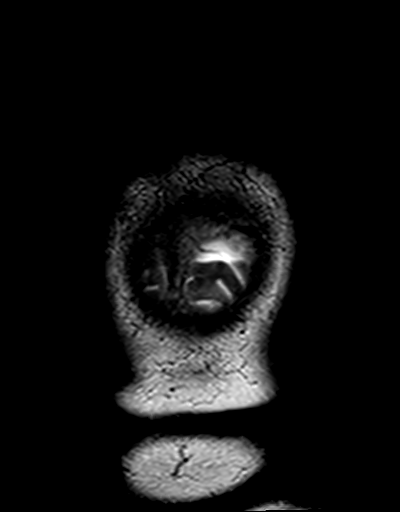
[im 28/28]
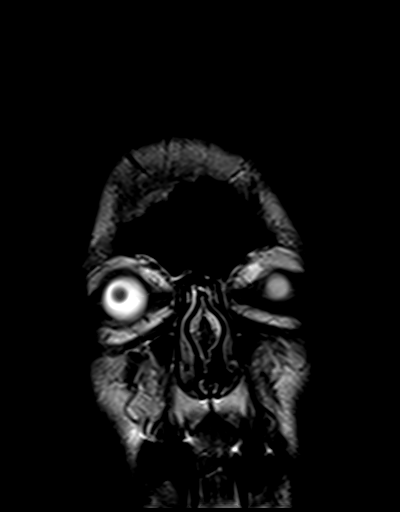

[Series 14: t1_mpr_tra post · axial · 1.0mm · 0.75mm/px · z∈[-31,+114]mm · 9 of 144 slices shown]
[im 1/144]
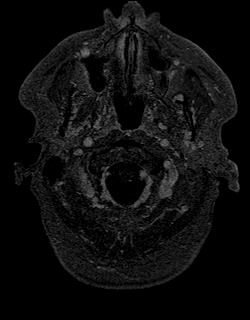
[im 18/144]
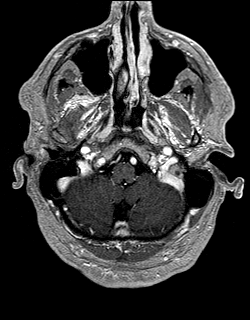
[im 36/144]
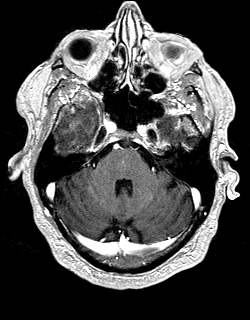
[im 54/144]
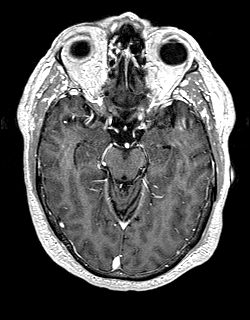
[im 72/144]
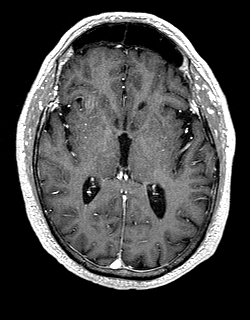
[im 90/144]
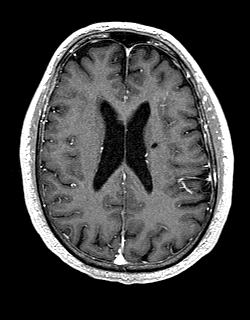
[im 108/144]
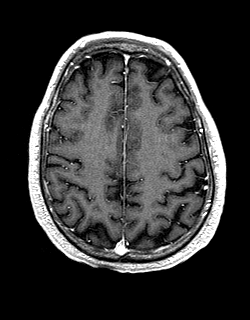
[im 126/144]
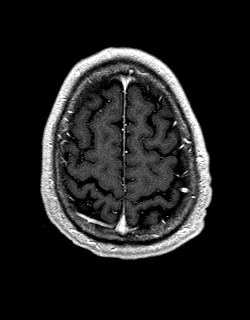
[im 144/144]
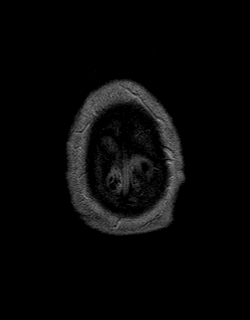

[Series 15: post cor · coronal · 5.0mm · 0.45mm/px · 2 of 28 slices shown]
[im 1/28]
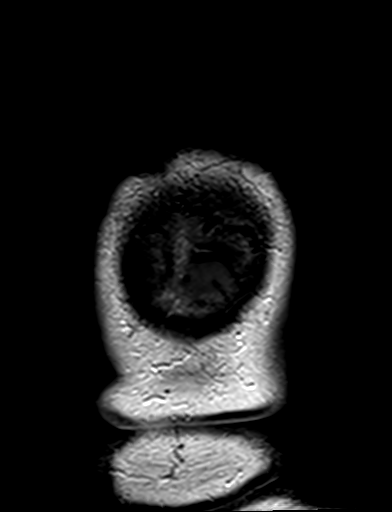
[im 28/28]
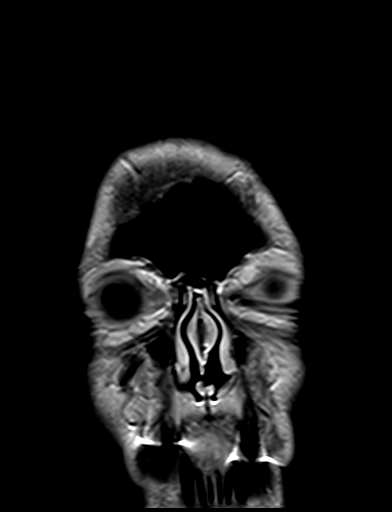

[48 of 48 positions shown; findings below may reference images not displayed]

FINDINGS: Brain: Cerebral volume is within normal limits for age. No
restricted diffusion to suggest acute infarction. No midline shift,
mass effect, evidence of mass lesion, ventriculomegaly, extra-axial
collection or acute intracranial hemorrhage. Cervicomedullary
junction and pituitary are within normal limits.

There is a small chronic lacunar infarct tracking from the left
corona radiata to the left lentiform. This is facilitated on
diffusion. And there is a small chronic infarct in the posterior
cerebellum on the right (series 13, image 6). But elsewhere
generally normal for age gray and white matter signal throughout the
brain. No cortical encephalomalacia or chronic cerebral blood
products identified. There is minimal T2 heterogeneity in the right
pons.

No abnormal enhancement identified (minimal central pontine
capillary telangiectasia, normal variant). No dural thickening.

Vascular: Major intracranial vascular flow voids are preserved. The
distal left vertebral artery appears to be dominant. The major dural
venous sinuses are enhancing and appear to be patent.

Skull and upper cervical spine: Negative for age visible cervical
spine. Visualized bone marrow signal is within normal limits.

Sinuses/Orbits: Negative orbits. Only trace paranasal sinus mucosal
thickening.

Other: Mastoid air cells are clear. Visible internal auditory
structures appear normal. Negative visible scalp and face.
IMPRESSION: 1. No acute or subacute intracranial abnormality identified.
2. There are small chronic small vessel/lacunar type infarcts in the
left corona radiata and lentiform, and right cerebellum.

## 2021-05-09 MED ORDER — GADOBENATE DIMEGLUMINE 529 MG/ML IV SOLN
19.0000 mL | Freq: Once | INTRAVENOUS | Status: AC | PRN
  Administered 2021-05-09: 19 mL via INTRAVENOUS

## 2021-05-13 DIAGNOSIS — H40013 Open angle with borderline findings, low risk, bilateral: Secondary | ICD-10-CM | POA: Diagnosis not present

## 2021-05-13 DIAGNOSIS — H2513 Age-related nuclear cataract, bilateral: Secondary | ICD-10-CM | POA: Diagnosis not present

## 2021-05-13 DIAGNOSIS — H04123 Dry eye syndrome of bilateral lacrimal glands: Secondary | ICD-10-CM | POA: Diagnosis not present

## 2021-05-13 DIAGNOSIS — H43813 Vitreous degeneration, bilateral: Secondary | ICD-10-CM | POA: Diagnosis not present

## 2021-05-16 NOTE — Progress Notes (Signed)
Assessment/Plan:   1.  Dysarthria and possibly dysphasia  -While patient and family both state that this is intermittent and lasting a maximum of 5 minutes, the patient most certainly had pseudobulbar speech while in the room today, and really lasted the entire visit.  It was not profound, but was certainly present.  We will go ahead and start working this up.  We will do EMG.  -we will do acetylcholine receptor antibodies, as he does have some history of diplopia, although that was not recent.  -Depending on the results of the above, we may pursue further testing.  His father may have had SCA (reports some type of degeneration of the cerebellum).  This certainly could explain speech and gait trouble, but I did not see a lot of ataxia in him today.  Because genetic testing is very expensive, we decided to hold on that for now.  -Reassured him that I saw no evidence of Parkinson's disease today.  I also was not convinced of an atypical state.  Patient's brother does have Parkinson's disease.  -If patient continues to describe some episodic events, we may try to capture this on EEG.  2.  PBA  -Patient does have some pseudobulbar affect today, and I wonder if this is not part of something larger.  We will monitor this as we continue to work him up.   Subjective:   Michael Spencer was seen today in the movement disorders clinic for neurologic consultation at the request of Wenda Low, MD.  The consultation is for the evaluation of balance difficulty, gait change and to rule out Parkinson's disease.  Medical records made available to me are reviewed.  Patient actually feels that that gait and balance change have been getting better. Patient feels that biggest issue is that he was having intermittent speech change.  Patient states that he would have episodes where he knew what he would like to say but could not get the proper words out.  Each episode would last between 1 and 5 minutes per medical  records made available to me..  Today, patient and family state that sx's started 3 months ago ago.  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  That has actually gotten better and it is not there any longer per pt but daughter isn't convinced it is gone.  No falls.  About 1.5 months ago, he noted slow onset of speech change.   He states that speech change comes and goes.  It lasts a max of 5 min per wife.  Speech change consists of slurred speech.  He denies trouble with word finding but daughter states that he has had episodes of word finding trouble.  He doesn't know what would bring on an episodes.   Specific Symptoms:  Tremor: No. Family hx of similar:  Yes.   brother with Parkinsons Disease ; father with "degeneration of the cerebellum" (? SCA) Voice: weak/soft Sleep: sleeps well  Vivid Dreams:  No.  Acting out dreams:  No. Wet Pillows: No. Postural symptoms:  Yes.    Falls?  No. Bradykinesia symptoms: difficulty getting out of a chair; no shuffling; no drooling Loss of smell:  No. Loss of taste:  No. Urinary Incontinence:  No. Difficulty Swallowing:  No. Handwriting, micrographia: No. Trouble with ADL's:  No.  Trouble buttoning clothing: No. Depression:  pt states normal; wife states little depressed Memory changes:  some short term changes Hallucinations:  No.  visual distortions: No. N/V:  No. Lightheaded:  No.  Syncope: No. Diplopia:  none now - had in remote past - perhaps over year ago Dyskinesia:  Yes.    MRI brain was completed on May 09, 2021.  There were chronic lacunar infarctions in the right cerebellum, left corona radiata and left lentiform nucleus.  I personally reviewed this.    ALLERGIES:   Allergies  Allergen Reactions   Percocet [Oxycodone-Acetaminophen] Other (See Comments)    hallucinations    CURRENT MEDICATIONS:  Current Outpatient Medications  Medication Instructions   aspirin  EC 81 mg, Oral, Daily   atorvastatin (LIPITOR) 40 mg, Oral, Daily at bedtime   losartan (COZAAR) 25 mg, Oral, Daily   metoprolol tartrate (LOPRESSOR) 12.5 mg, Oral, 2 times daily    Objective:   VITALS:   Vitals:   05/20/21 0937  BP: (!) 167/77  Pulse: (!) 56  SpO2: 97%  Weight: 211 lb 12.8 oz (96.1 kg)  Height: 6\' 1"  (1.854 m)   Patient had shirt off and pants were above knee for examination.  GEN:  The patient appears stated age and is in NAD.  Tearful in conversation HEENT:  Normocephalic, atraumatic.  The mucous membranes are moist. The superficial temporal arteries are without ropiness or tenderness.  No tongue fasciculations.  He does have a geographic tongue. CV: Bradycardic.  Regular. Lungs:  CTAB Neck/HEME:  There are no carotid bruits bilaterally.  Neurological examination:  Orientation: The patient is alert and oriented x3.  Cranial nerves: There is good facial symmetry. Extraocular muscles are intact.  There is no upgaze or downgaze paresis.  The visual fields are full to confrontational testing. The speech is fluent and pseudobulbar in quality.  He has trouble with the guttural sounds. Soft palate rises symmetrically and there is no tongue deviation. Hearing is intact to conversational tone. Sensation: Sensation is intact to light throughout.  Vibration is intact to the bilateral ankle.  There is no extinction with double simultaneous stimulation. Motor: Strength is 5/5 in the bilateral upper and lower extremities.   Shoulder shrug is equal and symmetric.  There is no pronator drift.  There were no fasciculations across the chest, back, arms or legs.  There were no tongue fasciculations. Deep tendon reflexes: Deep tendon reflexes are 2/4 at the bilateral biceps, triceps, brachioradialis, patella and achilles.  He is not hyperreflexic.  Plantar responses are downgoing bilaterally.  Movement examination: Tone: There is normal tone in the bilateral upper extremities.  The  tone in the lower extremities is normal.  Abnormal movements: None Coordination:  There is no decremation with RAM's, with any form of RAMS, including alternating supination and pronation of the forearm, hand opening and closing, finger taps, heel taps and toe taps. Gait and Station: The patient has no difficulty arising out of a deep-seated chair without the use of the hands. The patient's stride length is good.      Total time spent on today's visit was 50  minutes, including both face-to-face time and nonface-to-face time.  Time included that spent on review of records (prior notes available to me/labs/imaging if pertinent), discussing treatment and goals, answering patient's questions and coordinating care.  Cc:  Wenda Low, MD

## 2021-05-20 ENCOUNTER — Ambulatory Visit (INDEPENDENT_AMBULATORY_CARE_PROVIDER_SITE_OTHER): Payer: Medicare Other | Admitting: Neurology

## 2021-05-20 ENCOUNTER — Other Ambulatory Visit (INDEPENDENT_AMBULATORY_CARE_PROVIDER_SITE_OTHER): Payer: Medicare Other

## 2021-05-20 ENCOUNTER — Encounter: Payer: Self-pay | Admitting: Neurology

## 2021-05-20 ENCOUNTER — Other Ambulatory Visit: Payer: Self-pay

## 2021-05-20 VITALS — BP 167/77 | HR 56 | Ht 73.0 in | Wt 211.8 lb

## 2021-05-20 DIAGNOSIS — E7800 Pure hypercholesterolemia, unspecified: Secondary | ICD-10-CM | POA: Insufficient documentation

## 2021-05-20 DIAGNOSIS — J309 Allergic rhinitis, unspecified: Secondary | ICD-10-CM | POA: Insufficient documentation

## 2021-05-20 DIAGNOSIS — F482 Pseudobulbar affect: Secondary | ICD-10-CM | POA: Diagnosis not present

## 2021-05-20 DIAGNOSIS — H532 Diplopia: Secondary | ICD-10-CM | POA: Diagnosis not present

## 2021-05-20 DIAGNOSIS — G479 Sleep disorder, unspecified: Secondary | ICD-10-CM | POA: Insufficient documentation

## 2021-05-20 DIAGNOSIS — N200 Calculus of kidney: Secondary | ICD-10-CM | POA: Insufficient documentation

## 2021-05-20 DIAGNOSIS — R471 Dysarthria and anarthria: Secondary | ICD-10-CM | POA: Diagnosis not present

## 2021-05-20 DIAGNOSIS — R1310 Dysphagia, unspecified: Secondary | ICD-10-CM | POA: Diagnosis not present

## 2021-05-20 DIAGNOSIS — N400 Enlarged prostate without lower urinary tract symptoms: Secondary | ICD-10-CM | POA: Insufficient documentation

## 2021-05-20 DIAGNOSIS — I2510 Atherosclerotic heart disease of native coronary artery without angina pectoris: Secondary | ICD-10-CM

## 2021-05-20 DIAGNOSIS — G4733 Obstructive sleep apnea (adult) (pediatric): Secondary | ICD-10-CM | POA: Insufficient documentation

## 2021-05-20 NOTE — Patient Instructions (Signed)
Your provider has requested that you have labwork completed today. The lab is located on the Second floor at [address], within the [facility] office. When you get off the elevator, turn right and go in the [facility] [address]; the first brown door on the left.  Tell the ladies behind the desk that you are there for lab work. If you are not called within 15 minutes please check with the front desk.   Once you complete your labs you are free to go. You will receive a call or message via MyChart with your lab results.    

## 2021-06-02 LAB — MYASTHENIA GRAVIS PANEL 1
A CHR BINDING ABS: <0.3 nmol/L
STRIATED MUSCLE AB SCREEN: NEGATIVE

## 2021-06-24 ENCOUNTER — Ambulatory Visit (INDEPENDENT_AMBULATORY_CARE_PROVIDER_SITE_OTHER): Payer: Medicare Other | Admitting: Neurology

## 2021-06-24 ENCOUNTER — Other Ambulatory Visit: Payer: Self-pay

## 2021-06-24 DIAGNOSIS — R1310 Dysphagia, unspecified: Secondary | ICD-10-CM

## 2021-06-24 DIAGNOSIS — H532 Diplopia: Secondary | ICD-10-CM | POA: Diagnosis not present

## 2021-06-24 NOTE — Progress Notes (Signed)
Assessment/Plan:   1.  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  It was recommended that if symptoms persist that we repeat EMG in 6-8 months.  -Labs:  B12, Tsh (last done February, 2022), Free T4, SPEP and UPEP with immunofixation, calcium, phosphorous, PTH, Copper, Lyme, Heavy metal screen  -Reassured him that I saw no evidence of Parkinson's disease today.  I also was not convinced of an atypical state.  Patient's brother does have Parkinson's disease.  -If patient continues to describe some episodic events, we may try to capture this on EEG but I don't think that this is all episodic since I can hear it in his speech  -we will do MRI cervical spine  -Offered patient second opinion.  This was declined today.  2.  PBA  -Patient does have some pseudobulbar affect today, and I wonder if this is not part of something larger.  We will monitor this as we continue to work him up.  3.  I will plan to see the patient in the next 5 months, at which point we will plan to order EMG.  In the meantime, I told patient and wife that they are to call me should any new neurologic symptoms arise.  I gave them a list of things that could potentially be concerning.   Subjective:   Michael Spencer was seen today in the movement disorders clinic for follow-up for his EMG.  Pt with wife who supplements hx.  EMG did demonstrate chronic neurogenic changes of hypoglossus muscle.  Discussed this personally with Dr. Posey Pronto.  Acetylcholine receptor antibodies were negative.  MRI brain with and without gadolinium was performed in December and was nonacute, with evidence of small vessel disease and evidence of lacunar infarcts in the left corona radiata and lentiform nucleus and right cerebellum.    ALLERGIES:   Allergies  Allergen Reactions   Percocet [Oxycodone-Acetaminophen] Other (See Comments)    hallucinations     CURRENT MEDICATIONS:  Current Outpatient Medications  Medication Instructions   aspirin EC 81 mg, Oral, Daily   atorvastatin (LIPITOR) 40 mg, Oral, Daily at bedtime   losartan (COZAAR) 25 mg, Oral, Daily   metoprolol tartrate (LOPRESSOR) 12.5 mg, Oral, 2 times daily    Objective:   VITALS:   Vitals:   06/26/21 0958  BP: 124/72  Pulse: 64  SpO2: 98%  Weight: 212 lb 3.2 oz (96.3 kg)  Height: 6\' 1"  (1.854 m)    Patient had shirt off and pants were above knee for examination.  GEN:  The patient appears stated age and is in NAD.  Tearful in conversation when he discusses his family history of Parkinson's disease HEENT:  Normocephalic, atraumatic.  The mucous membranes are moist. The superficial temporal arteries are without ropiness or tenderness.  No tongue fasciculations.  He does have a geographic tongue.  There is some tongue atrophy. CV: Bradycardic.  Regular. Lungs:  CTAB Neck/HEME:  There are no carotid bruits bilaterally.  Neurological examination:  Orientation: The patient is alert and oriented x3.  Cranial nerves: There is good facial symmetry. Extraocular muscles are intact.  There is no upgaze or downgaze paresis.  He does have some trouble with smooth pursuit.  The visual fields are full to confrontational testing. The speech is fluent and pseudobulbar in quality.  He has trouble with the guttural sounds. Soft palate rises symmetrically and there is no tongue deviation. Hearing is intact  to conversational tone. Sensation: Sensation is intact to light throughout.   Motor: Strength is 5/5 in the bilateral upper and lower extremities.   Shoulder shrug is equal and symmetric.  There is no pronator drift.  There were no fasciculations across the chest, back, arms or legs.  There were no tongue fasciculations. Deep tendon reflexes: Deep tendon reflexes are 2/4 at the bilateral biceps, triceps, brachioradialis, patella and achilles.  He is not hyperreflexic.  Plantar  responses are downgoing bilaterally.  Movement examination: Tone: There is normal tone in the bilateral upper extremities.  The tone in the lower extremities is normal.  Abnormal movements: None Coordination:  There is no decremation with RAM's, with any form of RAMS, including alternating supination and pronation of the forearm, hand opening and closing, finger taps, heel taps and toe taps.     Total time spent on today's visit was 48minutes, including both face-to-face time and nonface-to-face time.  Time included that spent on review of records (prior notes available to me/labs/imaging if pertinent), discussing treatment and goals, answering patient's questions and coordinating care.  Cc:  Wenda Low, MD

## 2021-06-24 NOTE — Procedures (Signed)
[facility]  [address], [place]  [place], [place] [address] Tel: [phone] Fax:  [phone] Test Date:  06/24/2021  Patient: Michael Spencer DOB: 04-30-1946 Physician: Narda Amber, DO  Sex: Male Height: 6\' 1"  Ref Phys: Alonza Bogus, D.O.  ID#: [id number]   Technician:    Patient Complaints: This is a 76 year old man referred for evaluation of dysarthria.  NCV & EMG Findings: Extensive electrodiagnostic testing of the right upper and lower extremities and additional studies of the bulbar muscles shows:  Right median, ulnar, sural, and superficial peroneal sensory responses are within normal limits. Right median, ulnar, peroneal, and tibial motor responses are within normal limits. Right tibial H reflex studies within normal limits. In the right upper extremity, chronic motor axonal loss changes are seen affecting the pronator teres and biceps muscles, without accompanying active denervation. In the right lower extremity, there is no evidence of active or chronic motor axonal loss changes affecting any of the tested extremity. Sparse chronic motor axon loss changes are seen in the hyoglossus muscle without accompanied active denervation. No fasciculations were seen in any of the tested muscles.  Impression: Chronic C7 radiculopathy affecting the right upper extremity, mild. In isolation, mild chronic neurogenic changes involving the hyoglossus muscle are of unclear clinical significance.  Repeat electrodiagnostic testing in 6 months, if clinically indicated. There is no evidence of sensorimotor neuropathy, wide spread disorder of anterior horn cells, or diffuse myopathy.   ___________________________ Narda Amber, DO    Nerve Conduction Studies Anti Sensory Summary Table   Stim Site NR Peak (ms) Norm Peak (ms) P-T Amp (V) Norm P-T Amp  Right Median Anti Sensory (2nd Digit)  32C  Wrist    3.3 <3.8 22.7 >10  Right Sup Peroneal Anti Sensory (Ant Lat Mall)   32C  12 cm    2.8 <4.6 7.2 >3  Right Sural Anti Sensory (Lat Mall)  32C  Calf    2.2 <4.6 9.7 >3  Right Ulnar Anti Sensory (5th Digit)  32C  Wrist    3.2 <3.2 10.6 >5   Motor Summary Table   Stim Site NR Onset (ms) Norm Onset (ms) O-P Amp (mV) Norm O-P Amp Site1 Site2 Delta-0 (ms) Dist (cm) Vel (m/s) Norm Vel (m/s)  Right Median Motor (Abd Poll Brev)  32C  Wrist    3.0 <4.0 7.4 >5 Elbow Wrist 5.6 32.0 57 >50  Elbow    8.6  6.7         Right Peroneal Motor (Ext Dig Brev)  32C  Ankle    4.0 <6.0 3.8 >2.5 B Fib Ankle 9.0 39.0 43 >40  B Fib    13.0  3.8  Poplt B Fib 1.9 10.0 53 >40  Poplt    14.9  3.8         Right Tibial Motor (Abd Hall Brev)  32C  Ankle    3.8 <6.0 5.0 >4 Knee Ankle 8.9 46.0 52 >40  Knee    12.7  3.4         Right Ulnar Motor (Abd Dig Minimi)  32C  Wrist    2.7 <3.1 11.5 >7 B Elbow Wrist 5.0 26.0 52 >50  B Elbow    7.7  10.3  A Elbow B Elbow 1.8 10.0 56 >50  A Elbow    9.5  9.9          H Reflex Studies   NR H-Lat (ms) Lat Norm (ms) L-R H-Lat (ms)  Right  Tibial (Gastroc)  32C     34.69 <35    EMG   Side Muscle Ins Act Fibs Psw Fasc Number Recrt Dur Dur. Amp Amp. Poly Poly. Comment  Right 1stDorInt Nml Nml Nml Nml Nml Nml Nml Nml Nml Nml Nml Nml N/A  Right Gastroc Nml Nml Nml Nml Nml Nml Nml Nml Nml Nml Nml Nml N/A  Right AntTibialis Nml Nml Nml Nml Nml Nml Nml Nml Nml Nml Nml Nml N/A  Right RectFemoris Nml Nml Nml Nml Nml Nml Nml Nml Nml Nml Nml Nml N/A  Right GluteusMed Nml Nml Nml Nml Nml Nml Nml Nml Nml Nml Nml Nml N/A  Right BicepsFemS Nml Nml Nml Nml Nml Nml Nml Nml Nml Nml Nml Nml N/A  Right PronatorTeres Nml Nml Nml Nml 1- Rapid Some 1+ Some 1+ Some 1+ N/A  Right Biceps Nml Nml Nml Nml 1- Rapid Some 1+ Some 1+ Some 1+ N/A  Right Triceps Nml Nml Nml Nml Nml Nml Nml Nml Nml Nml Nml Nml N/A  Right Deltoid Nml Nml Nml Nml Nml Nml Nml Nml Nml Nml Nml Nml N/A  Right Mentalis Nml Nml Nml Nml Nml Nml Nml Nml Nml Nml Nml Nml N/A  Right Hyoglossus Nml  Nml Nml Nml 1- Rapid Some 1+ Some 1+ Some 1+ N/A      Waveforms:

## 2021-06-26 ENCOUNTER — Ambulatory Visit (INDEPENDENT_AMBULATORY_CARE_PROVIDER_SITE_OTHER): Payer: Medicare Other | Admitting: Neurology

## 2021-06-26 ENCOUNTER — Encounter: Payer: Self-pay | Admitting: Neurology

## 2021-06-26 ENCOUNTER — Other Ambulatory Visit: Payer: Self-pay

## 2021-06-26 ENCOUNTER — Other Ambulatory Visit (INDEPENDENT_AMBULATORY_CARE_PROVIDER_SITE_OTHER): Payer: Medicare Other

## 2021-06-26 VITALS — BP 124/72 | HR 64 | Ht 73.0 in | Wt 212.2 lb

## 2021-06-26 DIAGNOSIS — R4789 Other speech disturbances: Secondary | ICD-10-CM

## 2021-06-26 DIAGNOSIS — R5383 Other fatigue: Secondary | ICD-10-CM

## 2021-06-26 DIAGNOSIS — Z5181 Encounter for therapeutic drug level monitoring: Secondary | ICD-10-CM

## 2021-06-26 DIAGNOSIS — E538 Deficiency of other specified B group vitamins: Secondary | ICD-10-CM | POA: Diagnosis not present

## 2021-06-26 DIAGNOSIS — M542 Cervicalgia: Secondary | ICD-10-CM | POA: Diagnosis not present

## 2021-06-26 DIAGNOSIS — R471 Dysarthria and anarthria: Secondary | ICD-10-CM

## 2021-06-26 DIAGNOSIS — R6889 Other general symptoms and signs: Secondary | ICD-10-CM

## 2021-06-26 DIAGNOSIS — R1310 Dysphagia, unspecified: Secondary | ICD-10-CM

## 2021-06-26 LAB — TSH: TSH: 2.4 u[IU]/mL (ref 0.35–5.50)

## 2021-06-26 LAB — CALCIUM: Calcium: 9.4 mg/dL (ref 8.4–10.5)

## 2021-06-26 LAB — VITAMIN B12: Vitamin B-12: 231 pg/mL (ref 211–911)

## 2021-06-26 LAB — PHOSPHORUS: Phosphorus: 3.5 mg/dL (ref 2.3–4.6)

## 2021-06-26 LAB — T4, FREE: Free T4: 0.71 ng/dL (ref 0.60–1.60)

## 2021-06-26 NOTE — Patient Instructions (Addendum)
Your provider has requested that you have labwork completed today. The lab is located on the Second floor at [address], within the [facility] office. When you get off the elevator, turn right and go in the [facility] [address]; the first brown door on the left.  Tell the ladies behind the desk that you are there for lab work. If you are not called within 15 minutes please check with the front desk.   Once you complete your labs you are free to go. You will receive a call or message via MyChart with your lab results.    A referral to [facility] has been placed for your MRI someone will contact you directly to schedule your appt. They are located at [address] Please contact them directly by calling [phone] with any questions regarding your referral.  

## 2021-06-30 LAB — PROTEIN ELECTROPHORESIS, URINE REFLEX

## 2021-07-01 LAB — LYME DISEASE, WESTERN BLOT
IgG P18 Ab.: ABSENT
IgG P23 Ab.: ABSENT
IgG P28 Ab.: ABSENT
IgG P30 Ab.: ABSENT
IgG P39 Ab.: ABSENT
IgG P45 Ab.: ABSENT
IgG P66 Ab.: ABSENT
IgG P93 Ab.: ABSENT
IgM P23 Ab.: ABSENT
IgM P39 Ab.: ABSENT
IgM P41 Ab.: ABSENT
Lyme IgG Wb: NEGATIVE
Lyme IgM Wb: NEGATIVE

## 2021-07-01 LAB — HEAVY METALS, BLOOD
Arsenic: 2 ug/L (ref 0–9)
Lead, Blood: <1 ug/dL (ref 0.0–3.4)
Mercury: <1 ug/L (ref 0.0–14.9)

## 2021-07-01 LAB — PROTEIN ELECTROPHORESIS, URINE REFLEX
Albumin ELP, Urine: 24.6 %
Alpha-1-Globulin, U: 10 %
Alpha-2-Globulin, U: 15.3 %
Beta Globulin, U: 31.4 %
Gamma Globulin, U: 18.7 %
Protein, Ur: 6.6 mg/dL

## 2021-07-02 LAB — PROTEIN ELECTROPHORESIS, SERUM
Abnormal Protein Band1: 0.5 g/dL — ABNORMAL HIGH
Albumin ELP: 3.6 g/dL — ABNORMAL LOW (ref 3.8–4.8)
Alpha 1: 0.3 g/dL (ref 0.2–0.3)
Alpha 2: 0.6 g/dL (ref 0.5–0.9)
Beta 2: 0.5 g/dL (ref 0.2–0.5)
Beta Globulin: 0.5 g/dL (ref 0.4–0.6)
Gamma Globulin: 1.5 g/dL (ref 0.8–1.7)
Total Protein: 7 g/dL (ref 6.1–8.1)

## 2021-07-02 LAB — IMMUNOFIXATION ELECTROPHORESIS
IgG (Immunoglobin G), Serum: 1426 mg/dL (ref 600–1540)
IgM, Serum: 75 mg/dL (ref 50–300)
Immunoglobulin A: 659 mg/dL — ABNORMAL HIGH (ref 70–320)

## 2021-07-02 LAB — PARATHYROID HORMONE, INTACT (NO CA): PTH: 30 pg/mL (ref 16–77)

## 2021-07-02 LAB — COPPER, SERUM: Copper: 94 ug/dL (ref 70–175)

## 2021-07-03 ENCOUNTER — Encounter: Payer: Self-pay | Admitting: Neurology

## 2021-07-03 ENCOUNTER — Telehealth: Payer: Self-pay | Admitting: Physician Assistant

## 2021-07-03 ENCOUNTER — Telehealth: Payer: Self-pay

## 2021-07-03 ENCOUNTER — Other Ambulatory Visit: Payer: Self-pay

## 2021-07-03 DIAGNOSIS — D472 Monoclonal gammopathy: Secondary | ICD-10-CM

## 2021-07-03 NOTE — Telephone Encounter (Signed)
Scheduled appt per 2/2 referral. Pt is aware of appt date and time. Pt is aware to arrive 15 mins prior to appt time.

## 2021-07-03 NOTE — Telephone Encounter (Signed)
Internal referral has been sent to Dr . Alvy Bimler for hematology

## 2021-07-08 ENCOUNTER — Telehealth: Payer: Self-pay | Admitting: Physician Assistant

## 2021-07-08 NOTE — Telephone Encounter (Signed)
R/s on 2/7 due to provider sch change, message has been left with pt

## 2021-07-14 ENCOUNTER — Ambulatory Visit
Admission: RE | Admit: 2021-07-14 | Discharge: 2021-07-14 | Disposition: A | Discharge status: Home / Self Care | Payer: Medicare Other | Source: Ambulatory Visit | Attending: Neurology | Admitting: Neurology

## 2021-07-14 DIAGNOSIS — M4312 Spondylolisthesis, cervical region: Secondary | ICD-10-CM | POA: Diagnosis not present

## 2021-07-14 DIAGNOSIS — R471 Dysarthria and anarthria: Secondary | ICD-10-CM

## 2021-07-14 DIAGNOSIS — M2578 Osteophyte, vertebrae: Secondary | ICD-10-CM | POA: Diagnosis not present

## 2021-07-14 DIAGNOSIS — R1310 Dysphagia, unspecified: Secondary | ICD-10-CM

## 2021-07-14 DIAGNOSIS — M542 Cervicalgia: Secondary | ICD-10-CM

## 2021-07-14 IMAGING — MR MR CERVICAL SPINE W/O CM
5 series · 36 of 48 positions shown · non-contrast
Comparison: None.

CLINICAL DATA: Neck pain, dysphagia, and slurred speech since
[REDACTED]. No injury or prior surgery.

EXAM:
MRI CERVICAL SPINE WITHOUT CONTRAST
TECHNIQUE: Multiplanar, multisequence MR imaging of the cervical spine was
performed. No intravenous contrast was administered.

[Series 3: T2 · sagittal · 3.0mm · 0.41mm/px · 8 of 20 slices shown (1 of 2)]
[im 1/20]
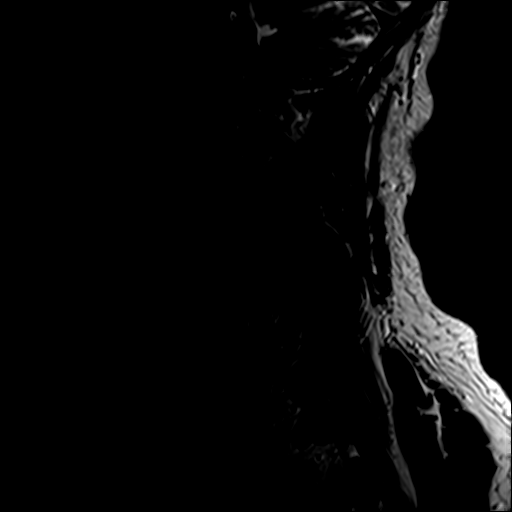
[im 3/20]
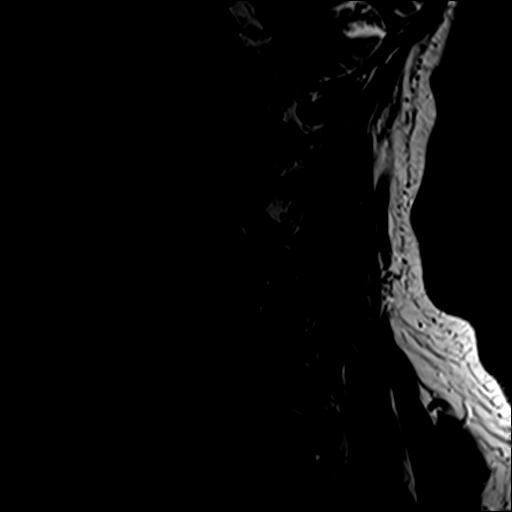
[im 6/20]
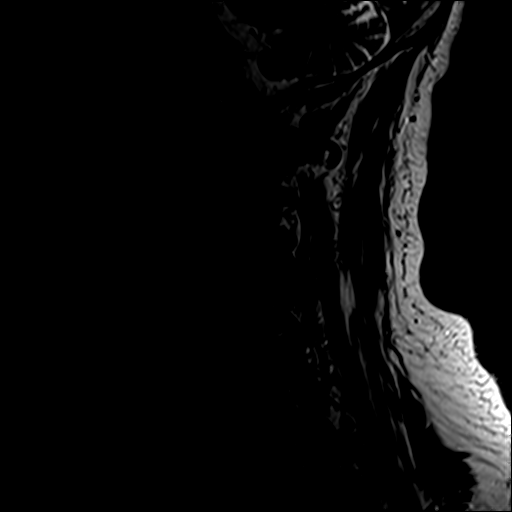
[im 9/20]
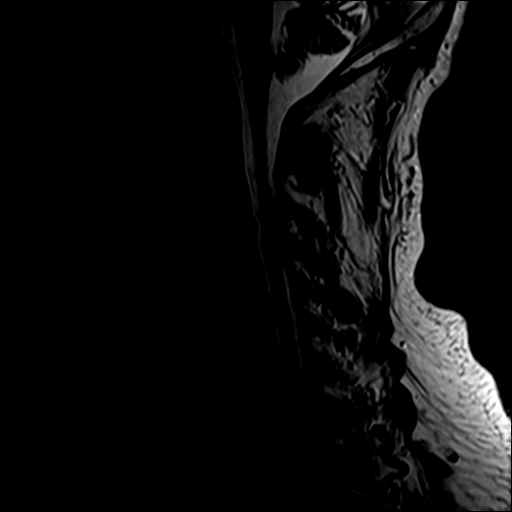
[im 11/20]
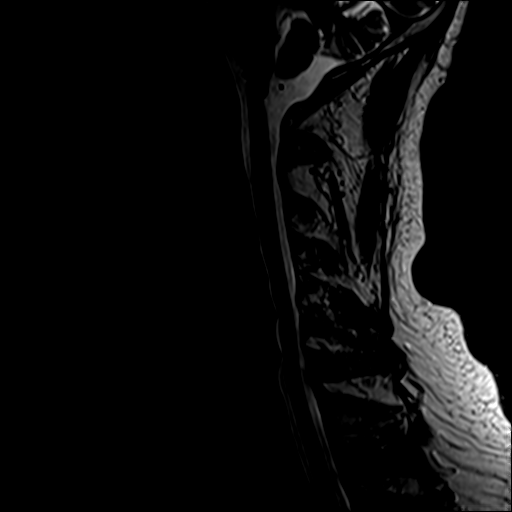
[im 14/20]
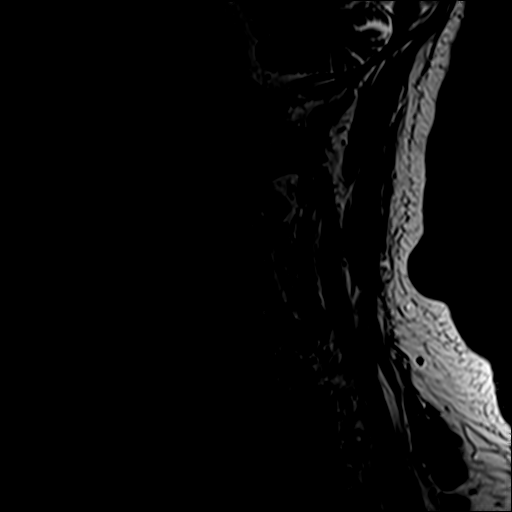
[im 17/20]
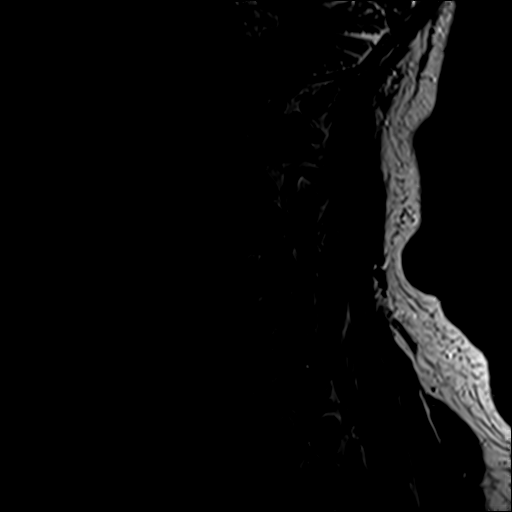
[im 20/20]
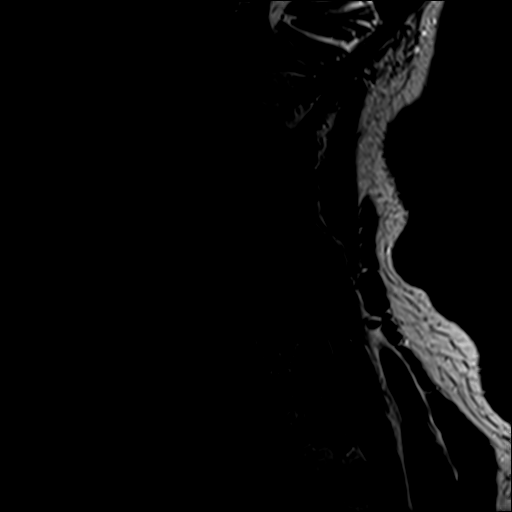

[Series 4: STIR · sagittal · 3.0mm · 0.82mm/px · 8 of 20 slices shown]
[im 1/20]
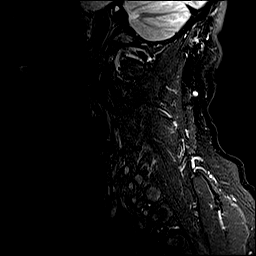
[im 3/20]
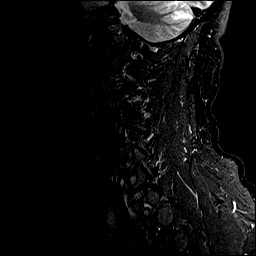
[im 6/20]
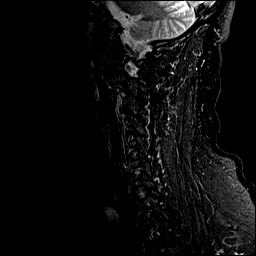
[im 9/20]
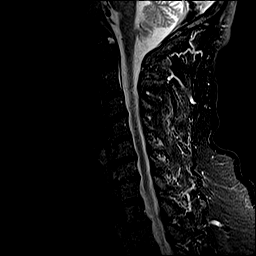
[im 11/20]
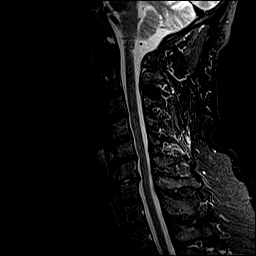
[im 14/20]
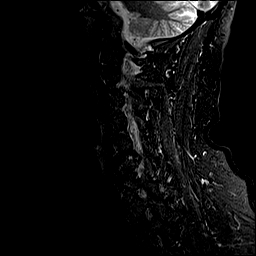
[im 17/20]
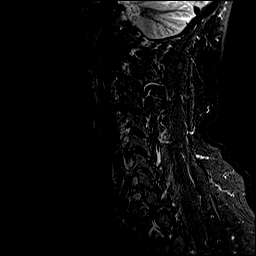
[im 20/20]
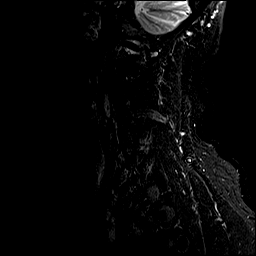

[Series 5: T1 · sagittal · 3.0mm · 0.82mm/px · 8 of 20 slices shown]
[im 1/20]
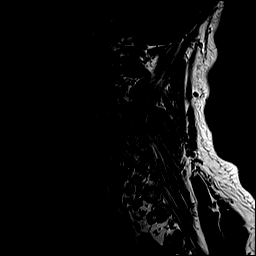
[im 3/20]
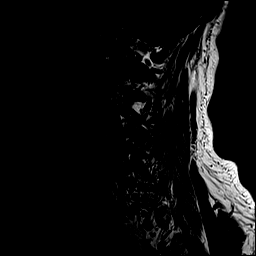
[im 6/20]
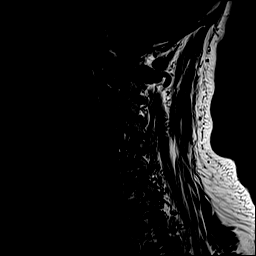
[im 9/20]
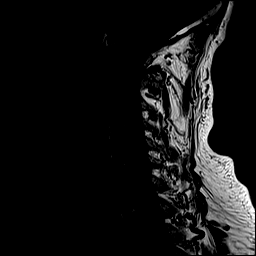
[im 11/20]
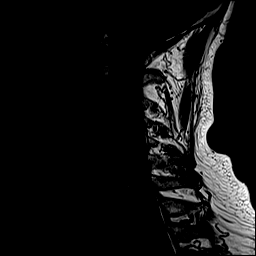
[im 14/20]
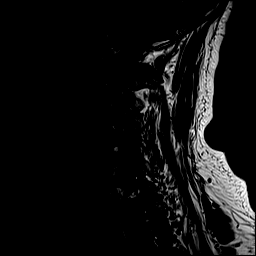
[im 17/20]
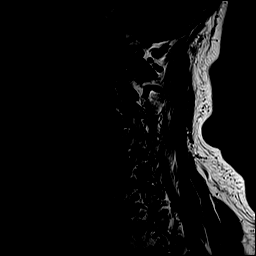
[im 20/20]
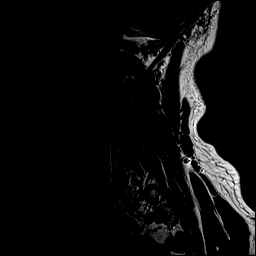

[Series 6: T2 · axial · 3.0mm · 0.70mm/px · z∈[-106,-8]mm · 9 of 29 slices shown (2 of 2)]
[im 1/29]
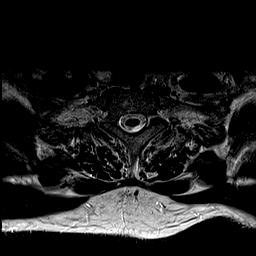
[im 6/29]
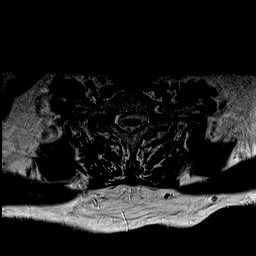
[im 8/29]
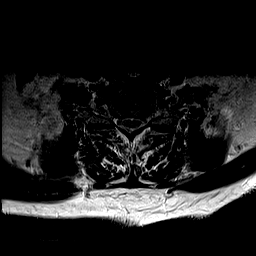
[im 13/29]
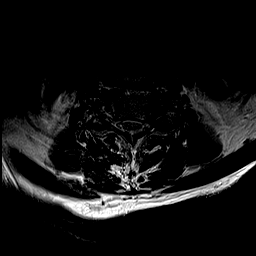
[im 16/29]
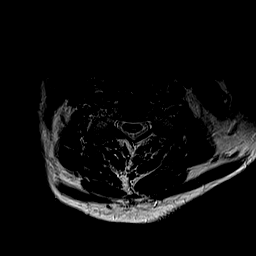
[im 21/29]
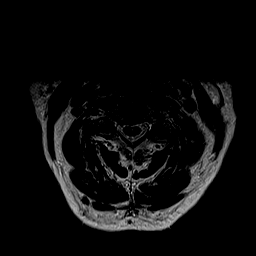
[im 23/29]
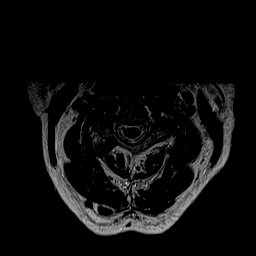
[im 26/29]
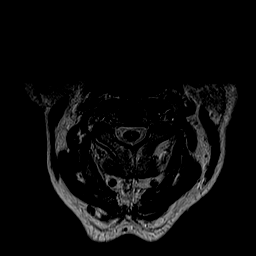
[im 29/29]
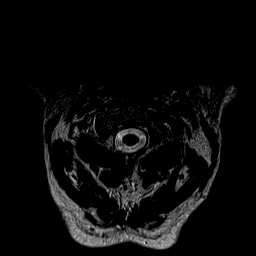

[Series 7: GRE · axial · 3.0mm · 0.35mm/px · z∈[-106,-82]mm · 3 of 29 slices shown]
[im 1/29]
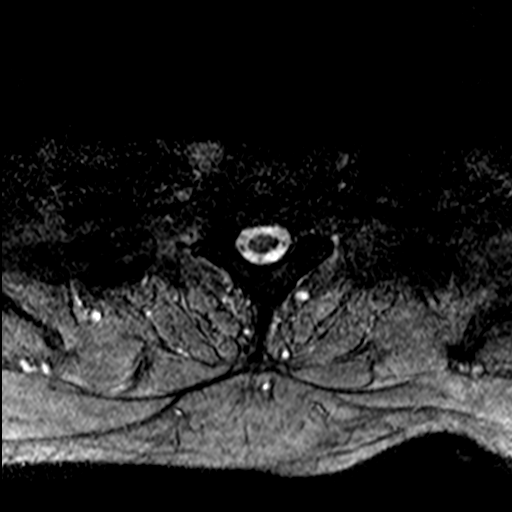
[im 6/29]
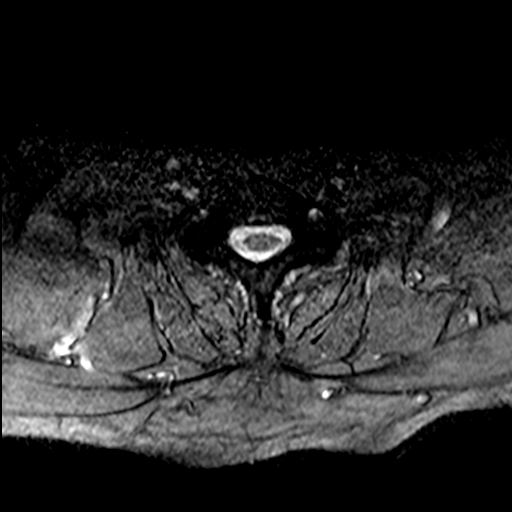
[im 8/29]
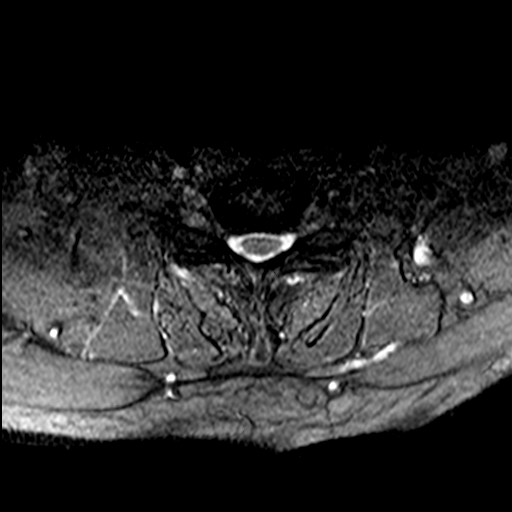

[36 of 48 positions shown; findings below may reference images not displayed]

FINDINGS: Alignment: Straightening and slight reversal of the normal cervical
lordosis. Trace anterolisthesis at C2-C3.

Vertebrae: No fracture, evidence of discitis, or bone lesion. Small
hemangioma at base of the dens.

Cord: Normal signal and morphology.

Posterior Fossa, vertebral arteries, paraspinal tissues: Small
chronic infarct in the right posterior cerebellum again noted.
Otherwise negative.

Disc levels:

C2-C3: Negative disc. Moderate right and mild left facet
arthropathy. No stenosis.

C3-C4: Tiny shallow broad-based posterior disc protrusion slightly
eccentric to the right. Mild right uncovertebral hypertrophy.
Moderate bilateral facet arthropathy. Mild right neuroforaminal
stenosis. No spinal canal or left neuroforaminal stenosis.

C4-C5: Negative disc. Moderate right and mild left facet
arthropathy. No stenosis.

C5-C6: Small posterior disc osteophyte complex eccentric to the
left. Left greater than right facet uncovertebral hypertrophy.
Slight flattening of the left ventral cord with borderline mild
spinal canal stenosis. Moderate to severe left neuroforaminal
stenosis. No right neuroforaminal stenosis.

C6-C7: Small posterior disc osteophyte complex and mild bilateral
facet uncovertebral hypertrophy. Borderline mild spinal canal
stenosis. No neuroforaminal stenosis.

C7-T1: Negative disc. Moderate bilateral facet arthropathy. No
stenosis.
IMPRESSION: 1. Multilevel degenerative changes of the cervical spine as
described above. Moderate to severe left neuroforaminal stenosis at
C5-C6. No significant spinal canal stenosis at any level.

## 2021-07-22 ENCOUNTER — Other Ambulatory Visit: Payer: Medicare Other

## 2021-07-22 ENCOUNTER — Encounter: Payer: Medicare Other | Admitting: Physician Assistant

## 2021-07-23 DIAGNOSIS — Z0000 Encounter for general adult medical examination without abnormal findings: Secondary | ICD-10-CM | POA: Diagnosis not present

## 2021-07-23 DIAGNOSIS — J309 Allergic rhinitis, unspecified: Secondary | ICD-10-CM | POA: Diagnosis not present

## 2021-07-23 DIAGNOSIS — R4781 Slurred speech: Secondary | ICD-10-CM | POA: Diagnosis not present

## 2021-07-23 DIAGNOSIS — Z1389 Encounter for screening for other disorder: Secondary | ICD-10-CM | POA: Diagnosis not present

## 2021-07-23 DIAGNOSIS — I25810 Atherosclerosis of coronary artery bypass graft(s) without angina pectoris: Secondary | ICD-10-CM | POA: Diagnosis not present

## 2021-07-23 DIAGNOSIS — E7800 Pure hypercholesterolemia, unspecified: Secondary | ICD-10-CM | POA: Diagnosis not present

## 2021-07-23 DIAGNOSIS — N400 Enlarged prostate without lower urinary tract symptoms: Secondary | ICD-10-CM | POA: Diagnosis not present

## 2021-07-23 DIAGNOSIS — I10 Essential (primary) hypertension: Secondary | ICD-10-CM | POA: Diagnosis not present

## 2021-07-23 DIAGNOSIS — N200 Calculus of kidney: Secondary | ICD-10-CM | POA: Diagnosis not present

## 2021-07-23 DIAGNOSIS — G4733 Obstructive sleep apnea (adult) (pediatric): Secondary | ICD-10-CM | POA: Diagnosis not present

## 2021-07-28 NOTE — Progress Notes (Signed)
[facility] [place] [phone]   Fax:([phone]) [place] NOTE  Patient Care Team: Wenda Low, MD as PCP - General (Internal Medicine) Fay Records, MD as PCP - Cardiology (Cardiology) Tat, Eustace Quail, DO as Consulting Physician (Neurology)  Hematological/Oncological History 1) 06/26/2021: SPEP detected M protein measuring 0.5 g/dL.  Immunofixation shows a poorly-defined area of restricted protein mobility is detected and is reactive with IgA and kappa.   2) 07/29/2021: Establish care with Dede Query PA-C  CHIEF COMPLAINTS/PURPOSE OF CONSULTATION:  "Monoclonal gammopathy "  HISTORY OF PRESENTING ILLNESS:  [name] 76 y.o. male with medical history significant for BPH, coronary artery disease, hypertension, tinnitus and pseudobulbar/bulbar speech.  Patient is accompanied by his wife and daughter for this visit.  On exam today, Mr. Peddie reports developing slurring of his speech and difficulty with word recognition around November 2022.  He is under the care of Dr. Carles Collet from neurology and was diagnosed with pseudobulbar/bulbar speech.  Otherwise, patient reports no major changes to his health.  He has a good appetite and is able to complete his daily activities on his own.  He denies any nausea, vomiting or abdominal pain.  His bowel habits are regular without any diarrhea or constipation.  He denies easy bruising or signs of active bleeding.  He denies any back pain, hip pain or other bone pain.  He denies any fevers, chills, night sweats, shortness of breath, chest pain or cough.  He has no other complaints.  Rest of the 10 point ROS is below.  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   HTN (hypertension) 07/09/2017   Seasonal allergies 07/09/2017   Tinnitus 07/09/2017   Varicose vein of leg 07/09/2017    SURGICAL HISTORY: Past Surgical History:  Procedure Laterality Date   CORONARY ARTERY BYPASS GRAFT     HERNIA REPAIR Right 2005   NOSE SURGERY  1981   TRANSURETHRAL RESECTION OF PROSTATE  1988   VARICOSE VEIN SURGERY Left 2014    SOCIAL HISTORY: Social History   Socioeconomic History   Marital status: Married    Spouse name: Not on file   Number of children: Not on file   Years of education: Not on file   Highest education level: Not on file  Occupational History   Occupation: retired    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, academic  Tobacco Use   Smoking status: Former    Types: Cigarettes   Smokeless tobacco: Never  Substance and Sexual Activity   Alcohol use: Not Currently    Comment: rarely   Drug use: No   Sexual activity: Not on file  Other Topics Concern   Not on file  Social History Narrative   Right handed   Lives with wife    Retired    Investment banker, operational of Radio broadcast assistant Strain: Not on file  Food Insecurity: Not on file  Transportation Needs: Not on file  Physical Activity: Not on file  Stress: Not on file  Social Connections: Not on file  Intimate Partner Violence: Not on file    FAMILY HISTORY: Family History  Problem Relation Age of Onset  Cancer - Other Mother        LIVER CANCER   Cancer Father        PANCREATIC   Diabetes Father    Hypertension Father    CAD Sister    CAD Sister    CAD Brother    Lymphoma Brother     ALLERGIES:  is allergic to percocet [oxycodone-acetaminophen].  MEDICATIONS:  Current Outpatient Medications  Medication Sig Dispense Refill   aspirin EC 81 MG tablet Take 81 mg by mouth daily.     atorvastatin (LIPITOR) 40 MG tablet Take 40 mg at bedtime by mouth.  3   losartan (COZAAR) 25 MG tablet Take 25 mg daily by mouth.   2   metoprolol tartrate (LOPRESSOR) 25 MG tablet Take 12.5 mg 2 (two) times daily by mouth.  1   No current facility-administered medications for this visit.    REVIEW OF SYSTEMS:   Constitutional: ( - ) fevers, ( - )  chills , ( - ) night sweats Eyes: ( - ) blurriness of vision, ( - ) double vision, ( - ) watery eyes Ears, nose, mouth, throat, and face: ( - ) mucositis, ( - ) sore throat Respiratory: ( - ) cough, ( - ) dyspnea, ( - ) wheezes Cardiovascular: ( - ) palpitation, ( - ) chest discomfort, ( - ) lower extremity swelling Gastrointestinal:  ( - ) nausea, ( - ) heartburn, ( - ) change in bowel habits Skin: ( - ) abnormal skin rashes Lymphatics: ( - ) new lymphadenopathy, ( - ) easy bruising Neurological: ( - ) numbness, ( - ) tingling, ( - ) new weaknesses Behavioral/Psych: ( - ) mood change, ( - ) new changes  All other systems were reviewed with the patient and are negative.  PHYSICAL EXAMINATION: ECOG PERFORMANCE STATUS: 1 - Symptomatic but completely ambulatory  Vitals:   07/29/21 1445  BP: (!) 151/87  Pulse: 60  Resp: 17  Temp: 97.7 F (36.5 C)  SpO2: 99%   Filed Weights   07/29/21 1445  Weight: 213 lb (96.6 kg)    GENERAL: well appearing male in NAD  SKIN: skin color, texture, turgor are normal, no rashes or significant lesions EYES: conjunctiva are pink and non-injected, sclera clear OROPHARYNX: no exudate, no erythema; lips, buccal mucosa, and tongue normal  NECK: supple, non-tender LYMPH:  no palpable lymphadenopathy in the cervical, axillary or supraclavicular lymph nodes.  LUNGS: clear to auscultation and percussion with normal breathing effort HEART: regular rate & rhythm and no murmurs and no lower extremity edema ABDOMEN: soft, non-tender, non-distended, normal bowel sounds Musculoskeletal: no cyanosis of digits and no clubbing  PSYCH: alert & oriented x 3, intermittent slurring of speech NEURO: no focal motor/sensory deficits  LABORATORY DATA:   I have reviewed the data as listed CBC Latest Ref Rng & Units 07/29/2021  WBC 4.0 - 10.5 K/uL 5.5  Hemoglobin 13.0 - 17.0 g/dL 13.3  Hematocrit 39.0 - 52.0 % 42.4  Platelets 150 - 400 K/uL 232    CMP Latest Ref Rng & Units 07/29/2021 06/26/2021  Glucose 70 - 99 mg/dL 94 -  BUN 8 - 23 mg/dL 14 -  Creatinine 0.61 - 1.24 mg/dL 0.95 -  Sodium 135 - 145 mmol/L 137 -  Potassium 3.5 - 5.1 mmol/L 4.0 -  Chloride 98 - 111 mmol/L 103 -  CO2 22 - 32 mmol/L 31 -  Calcium 8.9 - 10.3 mg/dL 9.2 9.4  Total Protein 6.5 - 8.1  g/dL 7.7 7.0  Total Bilirubin 0.3 - 1.2 mg/dL 1.0 -  Alkaline Phos 38 - 126 U/L 80 -  AST 15 - 41 U/L 15 -  ALT 0 - 44 U/L 16 -   RADIOGRAPHIC STUDIES: I have personally reviewed the radiological images as listed and agreed with the findings in the report. MR CERVICAL SPINE WO CONTRAST  Result Date: 07/14/2021 CLINICAL DATA:  Neck pain, dysphagia, and slurred speech since December. No injury or prior surgery. EXAM: MRI CERVICAL SPINE WITHOUT CONTRAST TECHNIQUE: Multiplanar, multisequence MR imaging of the cervical spine was performed. No intravenous contrast was administered. COMPARISON:  None. FINDINGS: Alignment: Straightening and slight reversal of the normal cervical lordosis. Trace anterolisthesis at C2-C3. Vertebrae: No fracture, evidence of discitis, or bone lesion. Small hemangioma at base of the dens. Cord: Normal signal and morphology. 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 Otherwise negative. Disc levels: C2-C3: Negative disc. Moderate right and mild left facet arthropathy. No stenosis. C3-C4: Tiny shallow broad-based posterior disc protrusion slightly eccentric to the right. Mild right uncovertebral hypertrophy. Moderate bilateral facet arthropathy. Mild right neuroforaminal stenosis. No spinal canal or left neuroforaminal stenosis. C4-C5: Negative disc. Moderate right and mild left facet  arthropathy. No stenosis. C5-C6: Small posterior disc osteophyte complex eccentric to the left. Left greater than right facet uncovertebral hypertrophy. Slight flattening of the left ventral cord with borderline mild spinal canal stenosis. Moderate to severe left neuroforaminal stenosis. No right neuroforaminal stenosis. C6-C7: Small posterior disc osteophyte complex and mild bilateral facet uncovertebral hypertrophy. Borderline mild spinal canal stenosis. No neuroforaminal stenosis. C7-T1: Negative disc. Moderate bilateral facet arthropathy. No stenosis. IMPRESSION: 1. Multilevel degenerative changes of the cervical spine as described above. Moderate to severe left neuroforaminal stenosis at C5-C6. No significant spinal canal stenosis at any level. Electronically Signed   By: Titus Dubin M.D.   On: 07/14/2021 09:13    ASSESSMENT & PLAN Shreyas Piatkowski is a 76 y.o. male who presents to the clinic for evaluation for monoclonal gammopathy. Based most recent SPEP from 06/26/2021 that detected monoclonal protein measuring 0.5 g/dL. Immunofixation showed a poorly-defined area of restricted protein mobility is detected and is reactive with IgA and kappa.   Recommend to proceed with full workup with CBC, CMP, multiple myeloma panel, kappa/lambda light chain, UPEP, beta 2 microglobulin and LDH levels. Bone Survey will be obtained to evaluate for lytic lesions. Plan to follow up with results by phone. If no additional intervention is required, we will plan to see patient back in clinic in 3 months.    #Monoclonal gammopathy:  --Labs today to check CBC, CMP, multiple myeloma panel, kappa/lambda light chain, UPEP,beta 2 microglobulin and LDH levels.  --Need to obtain DG bone met surgery to evaluate for lytic lesions.  --Will consider bone marrow biopsy based on above workup --RTC in 3 months with labs unless further intervention is required.   #Vitamin B12 deficiency: --Most recent level from 06/26/2021 measured  at 231 pg/mL. --Currently oral vitamin B12 supplementation --Repeat vitamin B12 and MMA levels today   Orders Placed This Encounter  Procedures   DG Bone Survey Met    Standing Status:   Future    Standing Expiration Date:   07/28/2022    Order Specific Question:   Reason for Exam (SYMPTOM  OR DIAGNOSIS REQUIRED)    Answer:   monoclonal gammopathy    Order Specific Question:   Preferred imaging location?    Answer:  [facility]   CBC with Differential (Cancer Center Only)    Standing Status:   Future    Number of Occurrences:   1    Standing Expiration Date:   07/28/2022   CMP ([facility] only)    Standing Status:   Future    Number of Occurrences:   1    Standing Expiration Date:   07/28/2022   Multiple Myeloma Panel (SPEP&IFE w/QIG)    Standing Status:   Future    Number of Occurrences:   1    Standing Expiration Date:   07/28/2022   Kappa/lambda light chains    Standing Status:   Future    Number of Occurrences:   1    Standing Expiration Date:   07/28/2022   24-Hr Ur UPEP/UIFE/Light Chains/TP    Standing Status:   Future    Standing Expiration Date:   07/28/2022   Beta 2 microglobulin    Standing Status:   Future    Number of Occurrences:   1    Standing Expiration Date:   07/28/2022   Vitamin B12    Standing Status:   Future    Number of Occurrences:   1    Standing Expiration Date:   07/29/2022   Methylmalonic acid, serum    Standing Status:   Future    Number of Occurrences:   1    Standing Expiration Date:   07/29/2022    All questions were answered. The patient knows to call the clinic with any problems, questions or concerns.  I have spent a total of 60 minutes minutes of face-to-face and non-face-to-face time, preparing to see the patient, obtaining and/or reviewing separately obtained history, performing a medically appropriate examination, counseling and educating the patient, ordering tests/procedures,  documenting clinical information in the  electronic health record,and care coordination.   Dede Query, PA-C Department of Hematology/Oncology [place] at [facility] Phone: [phone]  Patient was seen with Dr. Lorenso Courier  I have read the above note and personally examined the patient. I agree with the assessment and plan as noted above.  Briefly Mr. Carson Bogden is a 76 year old male who presents for evaluation of monoclonal gammopathy.  He was noted to have an IgA kappa M protein detected on IFE.  We will conduct a full MGUS work-up to include SPEP, UPEP, serum free light chains, and metastatic survey.  Due to the IgA specificity of his monoclonal protein we will need to pursue a bone marrow biopsy.  The patient voices understanding of this plan moving forward.   Ledell Peoples, MD Department of Hematology/Oncology [place] at [facility] Phone: [phone] Pager: [phone] Email: [email or web address]

## 2021-07-29 ENCOUNTER — Inpatient Hospital Stay: Payer: Medicare Other

## 2021-07-29 ENCOUNTER — Encounter: Payer: Self-pay | Admitting: Physician Assistant

## 2021-07-29 ENCOUNTER — Other Ambulatory Visit: Payer: Self-pay

## 2021-07-29 ENCOUNTER — Inpatient Hospital Stay: Payer: Medicare Other | Attending: Physician Assistant | Admitting: Physician Assistant

## 2021-07-29 VITALS — BP 151/87 | HR 60 | Temp 97.7°F | Resp 17 | Ht 73.0 in | Wt 213.0 lb

## 2021-07-29 DIAGNOSIS — Z800 Family history of malignant neoplasm of digestive organs: Secondary | ICD-10-CM | POA: Diagnosis not present

## 2021-07-29 DIAGNOSIS — Z8249 Family history of ischemic heart disease and other diseases of the circulatory system: Secondary | ICD-10-CM | POA: Diagnosis not present

## 2021-07-29 DIAGNOSIS — Z79899 Other long term (current) drug therapy: Secondary | ICD-10-CM | POA: Diagnosis not present

## 2021-07-29 DIAGNOSIS — Z833 Family history of diabetes mellitus: Secondary | ICD-10-CM | POA: Diagnosis not present

## 2021-07-29 DIAGNOSIS — E538 Deficiency of other specified B group vitamins: Secondary | ICD-10-CM | POA: Insufficient documentation

## 2021-07-29 DIAGNOSIS — Z87891 Personal history of nicotine dependence: Secondary | ICD-10-CM | POA: Diagnosis not present

## 2021-07-29 DIAGNOSIS — Z808 Family history of malignant neoplasm of other organs or systems: Secondary | ICD-10-CM | POA: Diagnosis not present

## 2021-07-29 DIAGNOSIS — D472 Monoclonal gammopathy: Secondary | ICD-10-CM | POA: Diagnosis not present

## 2021-07-29 LAB — CBC WITH DIFFERENTIAL (CANCER CENTER ONLY)
Abs Immature Granulocytes: 0.01 10*3/uL (ref 0.00–0.07)
Basophils Absolute: 0 10*3/uL (ref 0.0–0.1)
Basophils Relative: 1 %
Eosinophils Absolute: 0.1 10*3/uL (ref 0.0–0.5)
Eosinophils Relative: 2 %
HCT: 42.4 % (ref 39.0–52.0)
Hemoglobin: 13.3 g/dL (ref 13.0–17.0)
Immature Granulocytes: 0 %
Lymphocytes Relative: 19 %
Lymphs Abs: 1 10*3/uL (ref 0.7–4.0)
MCH: 27.4 pg (ref 26.0–34.0)
MCHC: 31.4 g/dL (ref 30.0–36.0)
MCV: 87.2 fL (ref 80.0–100.0)
Monocytes Absolute: 0.6 10*3/uL (ref 0.1–1.0)
Monocytes Relative: 10 %
Neutro Abs: 3.8 10*3/uL (ref 1.7–7.7)
Neutrophils Relative %: 68 %
Platelet Count: 232 10*3/uL (ref 150–400)
RBC: 4.86 MIL/uL (ref 4.22–5.81)
RDW: 13.9 % (ref 11.5–15.5)
WBC Count: 5.5 10*3/uL (ref 4.0–10.5)
nRBC: 0 % (ref 0.0–0.2)

## 2021-07-29 LAB — CMP (CANCER CENTER ONLY)
ALT: 16 U/L (ref 0–44)
AST: 15 U/L (ref 15–41)
Albumin: 4.1 g/dL (ref 3.5–5.0)
Alkaline Phosphatase: 80 U/L (ref 38–126)
Anion gap: 3 — ABNORMAL LOW (ref 5–15)
BUN: 14 mg/dL (ref 8–23)
CO2: 31 mmol/L (ref 22–32)
Calcium: 9.2 mg/dL (ref 8.9–10.3)
Chloride: 103 mmol/L (ref 98–111)
Creatinine: 0.95 mg/dL (ref 0.61–1.24)
GFR, Estimated: >60 mL/min (ref >60)
Glucose, Bld: 94 mg/dL (ref 70–99)
Potassium: 4 mmol/L (ref 3.5–5.1)
Sodium: 137 mmol/L (ref 135–145)
Total Bilirubin: 1 mg/dL (ref 0.3–1.2)
Total Protein: 7.7 g/dL (ref 6.5–8.1)

## 2021-07-29 LAB — VITAMIN B12: Vitamin B-12: 582 pg/mL (ref 180–914)

## 2021-07-30 DIAGNOSIS — D472 Monoclonal gammopathy: Secondary | ICD-10-CM | POA: Insufficient documentation

## 2021-07-30 DIAGNOSIS — E538 Deficiency of other specified B group vitamins: Secondary | ICD-10-CM | POA: Insufficient documentation

## 2021-07-30 LAB — KAPPA/LAMBDA LIGHT CHAINS
Kappa free light chain: 41.2 mg/L — ABNORMAL HIGH (ref 3.3–19.4)
Kappa, lambda light chain ratio: 1.37 (ref 0.26–1.65)
Lambda free light chains: 30 mg/L — ABNORMAL HIGH (ref 5.7–26.3)

## 2021-07-30 LAB — BETA 2 MICROGLOBULIN, SERUM: Beta-2 Microglobulin: 1.7 mg/L (ref 0.6–2.4)

## 2021-07-31 DIAGNOSIS — Z79899 Other long term (current) drug therapy: Secondary | ICD-10-CM | POA: Insufficient documentation

## 2021-07-31 DIAGNOSIS — Z1371 Encounter for nonprocreative screening for genetic disease carrier status: Secondary | ICD-10-CM | POA: Diagnosis not present

## 2021-07-31 DIAGNOSIS — E538 Deficiency of other specified B group vitamins: Secondary | ICD-10-CM | POA: Diagnosis not present

## 2021-07-31 DIAGNOSIS — D472 Monoclonal gammopathy: Secondary | ICD-10-CM | POA: Insufficient documentation

## 2021-07-31 DIAGNOSIS — D72822 Plasmacytosis: Secondary | ICD-10-CM | POA: Insufficient documentation

## 2021-08-01 LAB — MULTIPLE MYELOMA PANEL, SERUM
Albumin SerPl Elph-Mcnc: 3.7 g/dL (ref 2.9–4.4)
Albumin/Glob SerPl: 1.1 (ref 0.7–1.7)
Alpha 1: 0.2 g/dL (ref 0.0–0.4)
Alpha2 Glob SerPl Elph-Mcnc: 0.6 g/dL (ref 0.4–1.0)
B-Globulin SerPl Elph-Mcnc: 1.4 g/dL — ABNORMAL HIGH (ref 0.7–1.3)
Gamma Glob SerPl Elph-Mcnc: 1.3 g/dL (ref 0.4–1.8)
Globulin, Total: 3.6 g/dL (ref 2.2–3.9)
IgA: 661 mg/dL — ABNORMAL HIGH (ref 61–437)
IgG (Immunoglobin G), Serum: 1428 mg/dL (ref 603–1613)
IgM (Immunoglobulin M), Srm: 83 mg/dL (ref 15–143)
Total Protein ELP: 7.3 g/dL (ref 6.0–8.5)

## 2021-08-01 LAB — METHYLMALONIC ACID, SERUM: Methylmalonic Acid, Quantitative: 140 nmol/L (ref 0–378)

## 2021-08-04 LAB — UPEP/UIFE/LIGHT CHAINS/TP, 24-HR UR
% BETA, Urine: 0 %
ALPHA 1 URINE: 0 %
Albumin, U: 0 %
Alpha 2, Urine: 0 %
Free Kappa Lt Chains,Ur: 16.62 mg/L (ref 1.17–86.46)
Free Kappa/Lambda Ratio: 9.39 (ref 1.83–14.26)
Free Lambda Lt Chains,Ur: 1.77 mg/L (ref 0.27–15.21)
GAMMA GLOBULIN URINE: 0 %
Total Protein, Urine-Ur/day: 109 mg/24 hr (ref 30–150)
Total Protein, Urine: 4.2 mg/dL
Total Volume: 2600

## 2021-08-06 ENCOUNTER — Telehealth: Payer: Self-pay | Admitting: Physician Assistant

## 2021-08-07 ENCOUNTER — Other Ambulatory Visit: Payer: Self-pay

## 2021-08-07 ENCOUNTER — Telehealth: Payer: Self-pay

## 2021-08-07 DIAGNOSIS — D472 Monoclonal gammopathy: Secondary | ICD-10-CM

## 2021-08-07 NOTE — Telephone Encounter (Signed)
I called Mr. Malachi Goold to review the lab results from 07/29/2021.  SPEP and immunofixation showed IgA monoclonal protein with kappa light chain specificity.  Serum free light chain ratio was not elevated.  Remaining work-up was negative including no evidence of monoclonal protein in the UPEP. ? ?Discussed that with an IgA monoclonal protein, we do recommend a bone marrow biopsy.  Patient was in agreement with the plan.  We will follow-up with the patient once biopsy results are available and reviewed. ? ?Bone mets survey is scheduled for 08/11/2021, we will follow-up with those results as well. ? ?Patient expressed understanding and satisfaction with the plan provided ?

## 2021-08-07 NOTE — Telephone Encounter (Signed)
Called pt to schedule BMBX per Dede Query, PA. Pt has an overall understanding of procedure and knows to arrive 08/12/21 at 0730 for check in. Pt also aware we will monitor for 30 min post-bx and he will have labs drawn peripherally same day. Message sent to scheduling high priority to schedule all appts. He verbalized thanks and understanding. ?

## 2021-08-11 ENCOUNTER — Other Ambulatory Visit: Payer: Self-pay

## 2021-08-11 ENCOUNTER — Ambulatory Visit (HOSPITAL_COMMUNITY)
Admission: RE | Admit: 2021-08-11 | Discharge: 2021-08-11 | Disposition: A | Discharge status: Home / Self Care | Payer: Medicare Other | Source: Ambulatory Visit | Attending: Physician Assistant | Admitting: Physician Assistant

## 2021-08-11 DIAGNOSIS — M898X8 Other specified disorders of bone, other site: Secondary | ICD-10-CM | POA: Diagnosis not present

## 2021-08-11 DIAGNOSIS — D472 Monoclonal gammopathy: Secondary | ICD-10-CM | POA: Diagnosis not present

## 2021-08-11 IMAGING — DX DG BONE SURVEY MET
9 of 10 series · 9 of 10 positions shown · non-contrast
Comparison: [DATE], [DATE].

CLINICAL DATA: Monoclonal gammopathy.

EXAM:
METASTATIC BONE SURVEY

[skull lat]
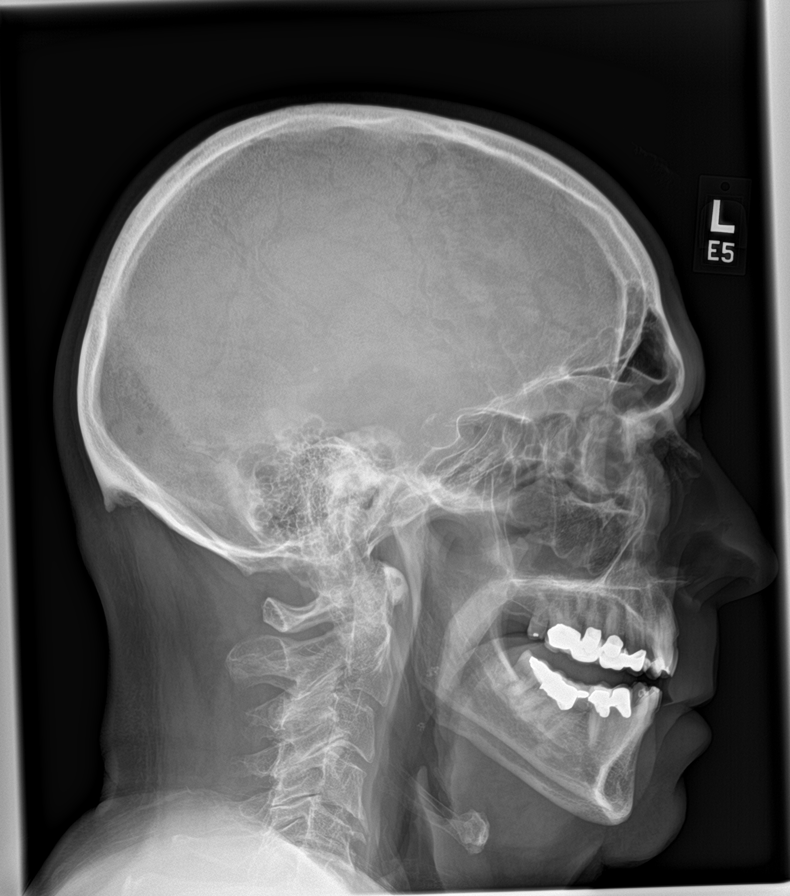

[shoulder ap (1 of 2)]
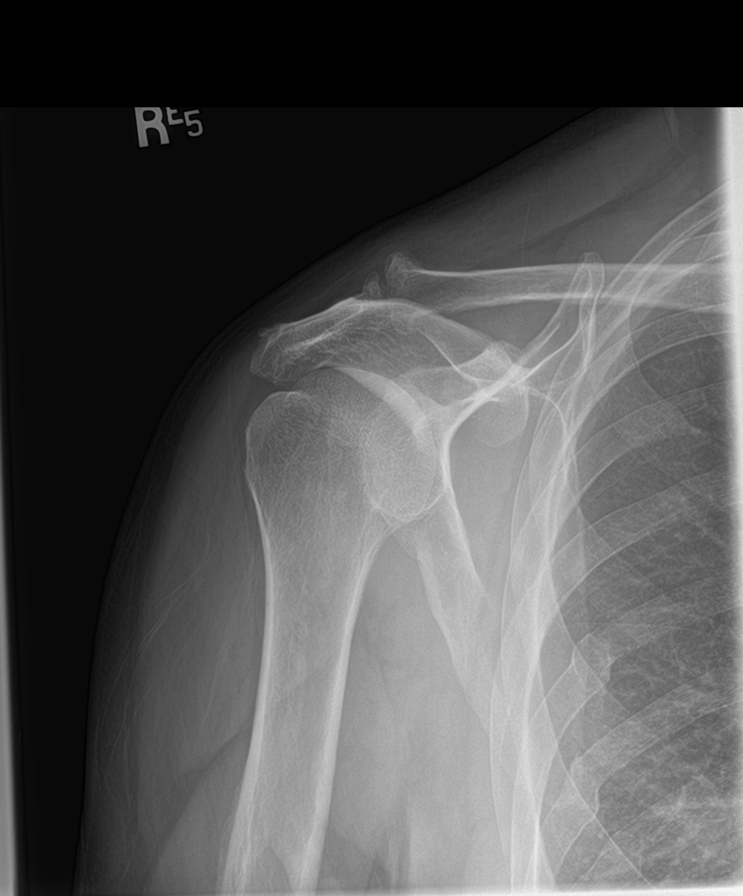

[shoulder ap (2 of 2)]
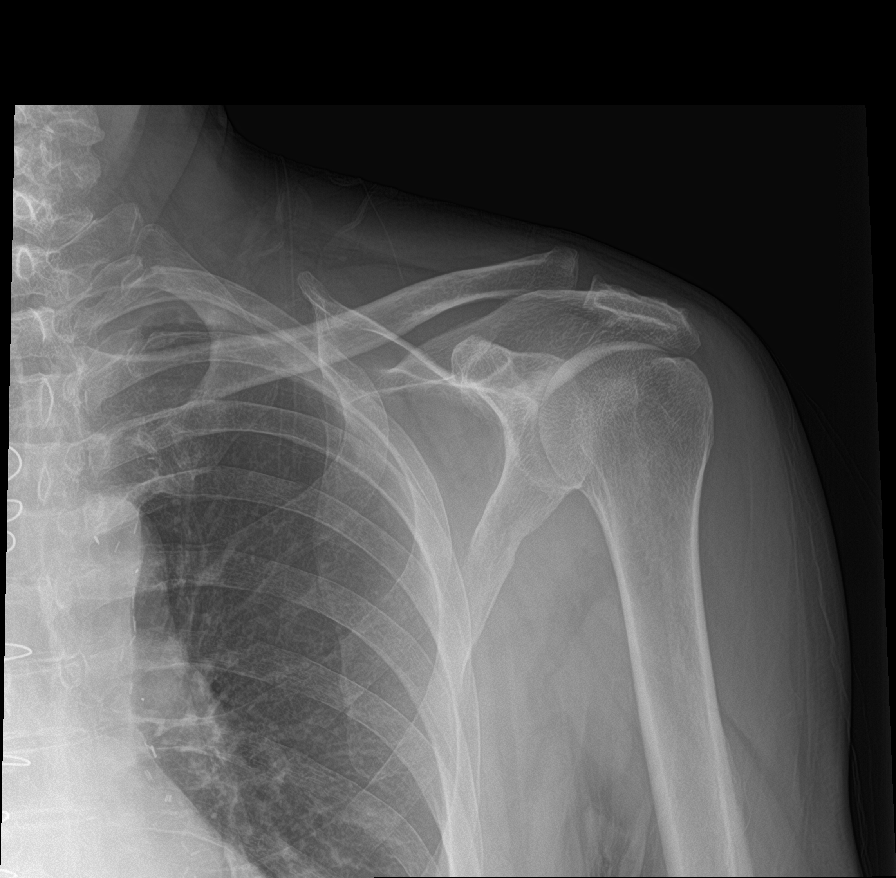

[humerus ap (1 of 2)]
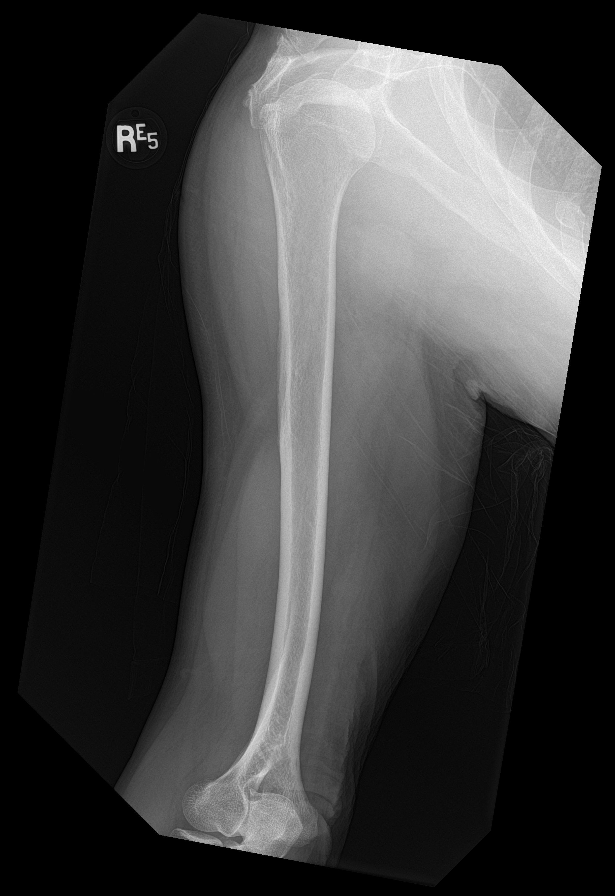

[humerus ap (2 of 2)]
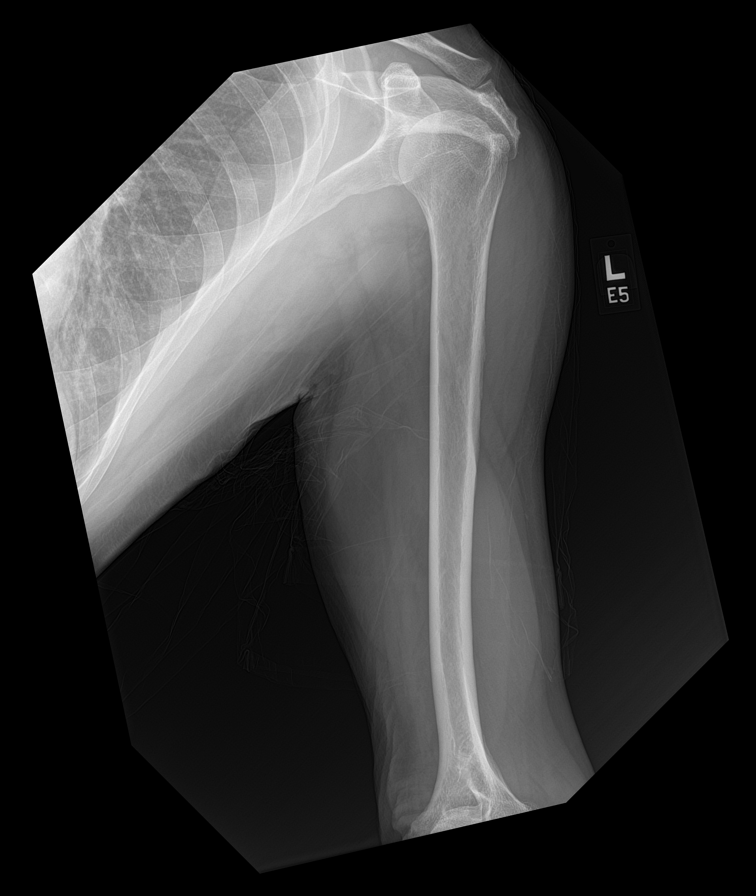

[forearm ap (1 of 2)]
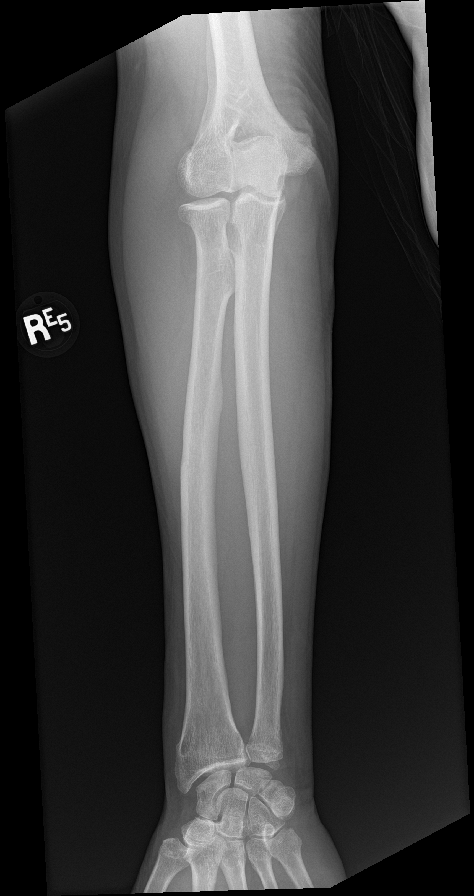

[forearm ap (2 of 2)]
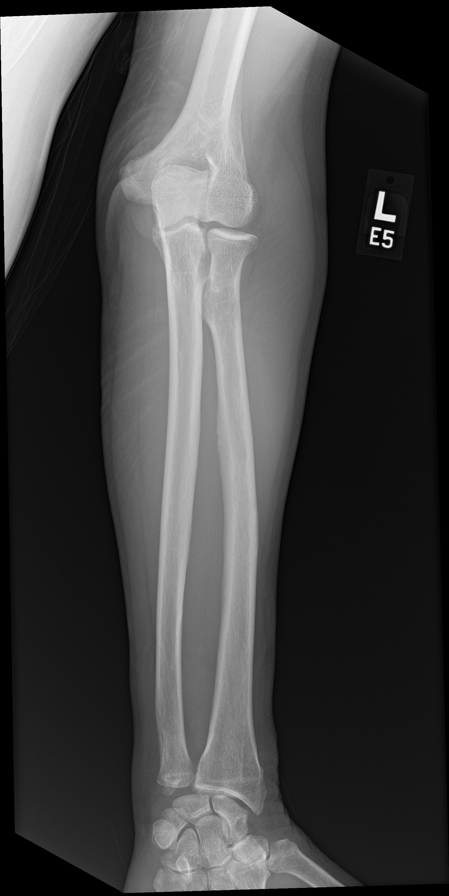

[c-spine ap]
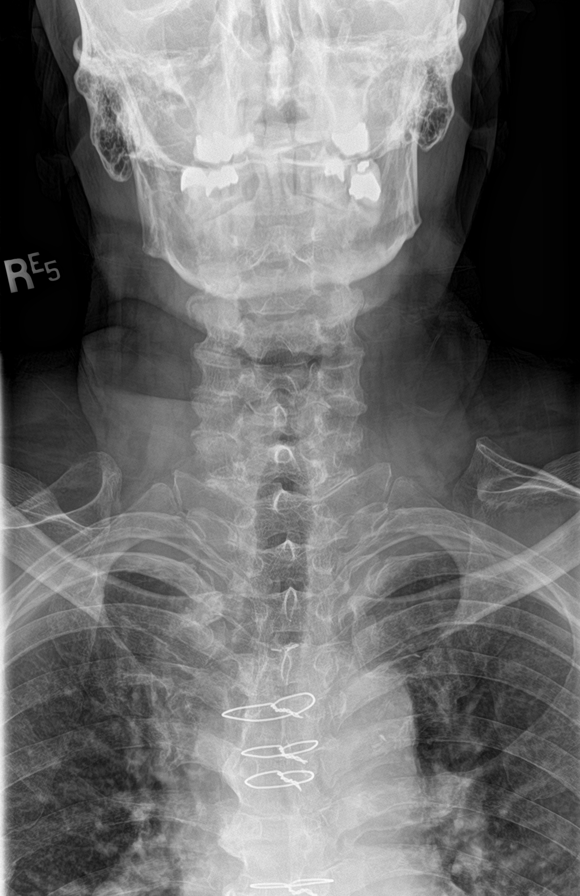

[c-spine lat]
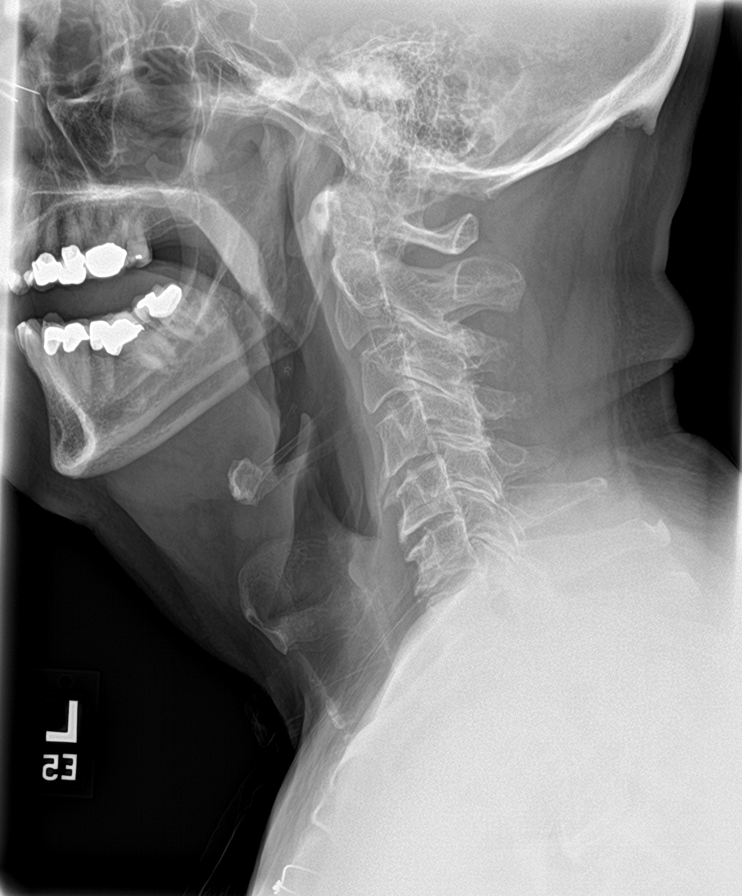

[9 of 10 positions shown; findings below may reference images not displayed]

FINDINGS: There is a single lucent lesion in the occipital region of the
calvarium measuring 2-3 mm. No other lytic, sclerotic, or
destructive lesion is seen within the bones. Old rib fractures are
present in the ribs on the right. No acute fracture or dislocation
is seen. Degenerative changes are present in the cervical, thoracic,
and lumbar spine, acromioclavicular joints and bilateral hips.
Sternotomy wires are noted over the midline.
IMPRESSION: Single tiny lucent lesion in the calvarium measuring 2-3 very mm,
most likely benign in etiology. No sclerotic or destructive lesion
is identified. Comparison with older imaging studies or correlation
with planned PET-CT is suggested.

## 2021-08-11 NOTE — Progress Notes (Unsigned)
INDICATION: MGUS, r/o myeloma  Brief examination was performed. ENT: adequate airway clearance Heart: regular rate and rhythm.No Murmurs Lungs: clear to auscultation, no wheezes, normal respiratory effort  Bone Marrow Biopsy and Aspiration Procedure Note   Informed consent was obtained and potential risks including bleeding, infection and pain were reviewed with the patient.  The patient's name, date of birth, identification, consent and allergies were verified prior to the start of procedure and time out was performed.  The {left/right:311354} posterior iliac crest was chosen as the site of biopsy.  The skin was prepped with ChloraPrep.   8 cc of 1% lidocaine was used to provide local anaesthesia.   10 cc of bone marrow aspirate was obtained followed by 1cm biopsy.  Pressure was applied to the biopsy site and bandage was placed over the biopsy site. Patient was made to lie on the back for 15 mins prior to discharge.  The procedure was tolerated well. COMPLICATIONS: None BLOOD LOSS: none The patient was discharged home in stable condition with a 1 week follow up to review results.  Patient was provided with post bone marrow biopsy instructions and instructed to call if there was any bleeding or worsening pain.  Specimens sent for flow cytometry, cytogenetics and additional studies.  Signed Scot Dock, NP

## 2021-08-12 ENCOUNTER — Encounter: Payer: Self-pay | Admitting: Adult Health

## 2021-08-12 ENCOUNTER — Inpatient Hospital Stay (HOSPITAL_BASED_OUTPATIENT_CLINIC_OR_DEPARTMENT_OTHER): Payer: Medicare Other | Admitting: Adult Health

## 2021-08-12 ENCOUNTER — Telehealth: Payer: Self-pay | Admitting: Physician Assistant

## 2021-08-12 ENCOUNTER — Inpatient Hospital Stay: Payer: Medicare Other | Attending: Physician Assistant

## 2021-08-12 VITALS — BP 125/85 | HR 52 | Temp 97.9°F | Resp 18

## 2021-08-12 DIAGNOSIS — D72822 Plasmacytosis: Secondary | ICD-10-CM | POA: Diagnosis not present

## 2021-08-12 DIAGNOSIS — D472 Monoclonal gammopathy: Secondary | ICD-10-CM | POA: Diagnosis not present

## 2021-08-12 DIAGNOSIS — E538 Deficiency of other specified B group vitamins: Secondary | ICD-10-CM | POA: Diagnosis not present

## 2021-08-12 DIAGNOSIS — Z1371 Encounter for nonprocreative screening for genetic disease carrier status: Secondary | ICD-10-CM | POA: Diagnosis not present

## 2021-08-12 DIAGNOSIS — Z79899 Other long term (current) drug therapy: Secondary | ICD-10-CM | POA: Diagnosis not present

## 2021-08-12 LAB — CMP (CANCER CENTER ONLY)
ALT: 14 U/L (ref 0–44)
AST: 12 U/L — ABNORMAL LOW (ref 15–41)
Albumin: 3.7 g/dL (ref 3.5–5.0)
Alkaline Phosphatase: 73 U/L (ref 38–126)
Anion gap: 6 (ref 5–15)
BUN: 15 mg/dL (ref 8–23)
CO2: 28 mmol/L (ref 22–32)
Calcium: 9.1 mg/dL (ref 8.9–10.3)
Chloride: 103 mmol/L (ref 98–111)
Creatinine: 1.13 mg/dL (ref 0.61–1.24)
GFR, Estimated: >60 mL/min (ref >60)
Glucose, Bld: 101 mg/dL — ABNORMAL HIGH (ref 70–99)
Potassium: 4.9 mmol/L (ref 3.5–5.1)
Sodium: 137 mmol/L (ref 135–145)
Total Bilirubin: 0.9 mg/dL (ref 0.3–1.2)
Total Protein: 6.7 g/dL (ref 6.5–8.1)

## 2021-08-12 LAB — CBC WITH DIFFERENTIAL (CANCER CENTER ONLY)
Abs Immature Granulocytes: 0.02 10*3/uL (ref 0.00–0.07)
Basophils Absolute: 0 10*3/uL (ref 0.0–0.1)
Basophils Relative: 1 %
Eosinophils Absolute: 0.1 10*3/uL (ref 0.0–0.5)
Eosinophils Relative: 1 %
HCT: 40.7 % (ref 39.0–52.0)
Hemoglobin: 12.8 g/dL — ABNORMAL LOW (ref 13.0–17.0)
Immature Granulocytes: 0 %
Lymphocytes Relative: 21 %
Lymphs Abs: 1.3 10*3/uL (ref 0.7–4.0)
MCH: 27.1 pg (ref 26.0–34.0)
MCHC: 31.4 g/dL (ref 30.0–36.0)
MCV: 86.2 fL (ref 80.0–100.0)
Monocytes Absolute: 0.8 10*3/uL (ref 0.1–1.0)
Monocytes Relative: 13 %
Neutro Abs: 4.2 10*3/uL (ref 1.7–7.7)
Neutrophils Relative %: 64 %
Platelet Count: 183 10*3/uL (ref 150–400)
RBC: 4.72 MIL/uL (ref 4.22–5.81)
RDW: 14.3 % (ref 11.5–15.5)
WBC Count: 6.5 10*3/uL (ref 4.0–10.5)
nRBC: 0 % (ref 0.0–0.2)

## 2021-08-12 MED ORDER — LIDOCAINE HCL 2 % IJ SOLN
INTRAMUSCULAR | Status: AC
  Filled 2021-08-12: qty 20

## 2021-08-12 NOTE — Telephone Encounter (Signed)
.  Called patient to schedule appointment per 3/14 inbasket, patient is aware of date and time.   ?

## 2021-08-12 NOTE — Progress Notes (Signed)
Pt monitored for 30 minutes post bone marrow biopsy. VSS. Biopsy site clear without swelling or drainage. Pt had no questions or complaints at time of discharge.  ?

## 2021-08-12 NOTE — Patient Instructions (Signed)
Bone Marrow Aspiration and Bone Marrow Biopsy, Adult, Care After This sheet gives you information about how to care for yourself after your procedure. Your health care provider may also give you more specific instructions. If you have problems or questions, contact your health careprovider. What can I expect after the procedure? After the procedure, it is common to have: Mild pain and tenderness. Swelling. Bruising. Follow these instructions at home: Puncture site care  Follow instructions from your health care provider about how to take care of the puncture site. Make sure you: Wash your hands with soap and water before and after you change your bandage (dressing). If soap and water are not available, use hand sanitizer. Change your dressing as told by your health care provider. Check your puncture site every day for signs of infection. Check for: More redness, swelling, or pain. Fluid or blood. Warmth. Pus or a bad smell.  Activity Return to your normal activities as told by your health care provider. Ask your health care provider what activities are safe for you. Do not lift anything that is heavier than 10 lb (4.5 kg), or the limit that you are told, until your health care provider says that it is safe. Do not drive for 24 hours if you were given a sedative during your procedure. General instructions  Take over-the-counter and prescription medicines only as told by your health care provider. Do not take baths, swim, or use a hot tub until your health care provider approves. Ask your health care provider if you may take showers. You may only be allowed to take sponge baths. If directed, put ice on the affected area. To do this: Put ice in a plastic bag. Place a towel between your skin and the bag. Leave the ice on for 20 minutes, 2-3 times a day. Keep all follow-up visits as told by your health care provider. This is important.  Contact a health care provider if: Your pain is not  controlled with medicine. You have a fever. You have more redness, swelling, or pain around the puncture site. You have fluid or blood coming from the puncture site. Your puncture site feels warm to the touch. You have pus or a bad smell coming from the puncture site. Summary After the procedure, it is common to have mild pain, tenderness, swelling, and bruising. Follow instructions from your health care provider about how to take care of the puncture site and what activities are safe for you. Take over-the-counter and prescription medicines only as told by your health care provider. Contact a health care provider if you have any signs of infection, such as fluid or blood coming from the puncture site. This information is not intended to replace advice given to you by your health care provider. Make sure you discuss any questions you have with your healthcare provider. Document Revised: 10/04/2018 Document Reviewed: 10/04/2018 Elsevier Patient Education  2022 Elsevier Inc.  

## 2021-08-14 LAB — SURGICAL PATHOLOGY

## 2021-08-18 ENCOUNTER — Telehealth: Payer: Self-pay | Admitting: Physician Assistant

## 2021-08-18 NOTE — Telephone Encounter (Signed)
I called Mr. [employer] and his wife to review the pathology results from the bone marrow biopsy. Findings are consistent with MGUS. Reviewed bone survey that was unremarkable for a single tiny lucent lesion in the calvarium measuring 2-3 mm, likely benign in etiology.  ? ?We will plan to see patient back in 3 months with repeat labs to monitor stability of M-protein. Plan to repeat bone survey in 3-6 months. Patient expressed understanding and satisfaction with the plan provided.   ?

## 2021-08-20 ENCOUNTER — Telehealth: Payer: Self-pay | Admitting: Neurology

## 2021-08-20 ENCOUNTER — Ambulatory Visit: Payer: Medicare Other | Admitting: Physician Assistant

## 2021-08-20 NOTE — Telephone Encounter (Signed)
Patient called and stated he wanted to move his appointment up sooner to discuss some things. He was referred to someone about his slurred speech, it came back as abnormal high protien, he will get blood tested every six months.  He wants to make sure he is doing everything he can to find out about his slurred speech. ?

## 2021-08-20 NOTE — Telephone Encounter (Signed)
Called patient and gave Dr. Arturo Morton recommendations at this time  ?

## 2021-08-20 NOTE — Telephone Encounter (Signed)
Patients current appointment is set for June  ?

## 2021-08-22 ENCOUNTER — Encounter (HOSPITAL_COMMUNITY): Payer: Self-pay

## 2021-09-04 ENCOUNTER — Telehealth: Payer: Self-pay | Admitting: Neurology

## 2021-09-04 NOTE — Telephone Encounter (Signed)
I gave patients wife the same advice I gave the patient himself when he called on 08-20-21. Dr. Carles Collet is very concerned and we are monitoring him. She is wanting to do testing but it is still to soon for that testing and that is why wee have that appointment scheduled for June   ?

## 2021-09-04 NOTE — Telephone Encounter (Signed)
Patients wife would like to know if Michael Spencer can be seen sooner than June. His slurring has become worse to where she cant understand him. She feels he is declining  ?

## 2021-11-19 ENCOUNTER — Other Ambulatory Visit: Payer: Self-pay | Admitting: Hematology and Oncology

## 2021-11-19 ENCOUNTER — Inpatient Hospital Stay: Payer: Medicare Other | Attending: Physician Assistant

## 2021-11-19 ENCOUNTER — Inpatient Hospital Stay (HOSPITAL_BASED_OUTPATIENT_CLINIC_OR_DEPARTMENT_OTHER): Payer: Medicare Other | Admitting: Hematology and Oncology

## 2021-11-19 VITALS — BP 145/88 | HR 51 | Temp 98.1°F | Resp 16 | Wt 203.7 lb

## 2021-11-19 DIAGNOSIS — I10 Essential (primary) hypertension: Secondary | ICD-10-CM | POA: Insufficient documentation

## 2021-11-19 DIAGNOSIS — Z800 Family history of malignant neoplasm of digestive organs: Secondary | ICD-10-CM | POA: Insufficient documentation

## 2021-11-19 DIAGNOSIS — Z8249 Family history of ischemic heart disease and other diseases of the circulatory system: Secondary | ICD-10-CM | POA: Diagnosis not present

## 2021-11-19 DIAGNOSIS — Z833 Family history of diabetes mellitus: Secondary | ICD-10-CM | POA: Insufficient documentation

## 2021-11-19 DIAGNOSIS — Z79899 Other long term (current) drug therapy: Secondary | ICD-10-CM | POA: Insufficient documentation

## 2021-11-19 DIAGNOSIS — Z807 Family history of other malignant neoplasms of lymphoid, hematopoietic and related tissues: Secondary | ICD-10-CM | POA: Diagnosis not present

## 2021-11-19 DIAGNOSIS — Z885 Allergy status to narcotic agent status: Secondary | ICD-10-CM | POA: Insufficient documentation

## 2021-11-19 DIAGNOSIS — D472 Monoclonal gammopathy: Secondary | ICD-10-CM

## 2021-11-19 DIAGNOSIS — Z87891 Personal history of nicotine dependence: Secondary | ICD-10-CM | POA: Insufficient documentation

## 2021-11-19 DIAGNOSIS — E538 Deficiency of other specified B group vitamins: Secondary | ICD-10-CM

## 2021-11-19 DIAGNOSIS — Z87442 Personal history of urinary calculi: Secondary | ICD-10-CM | POA: Diagnosis not present

## 2021-11-19 LAB — CBC WITH DIFFERENTIAL (CANCER CENTER ONLY)
Abs Immature Granulocytes: 0.02 10*3/uL (ref 0.00–0.07)
Basophils Absolute: 0 10*3/uL (ref 0.0–0.1)
Basophils Relative: 0 %
Eosinophils Absolute: 0.1 10*3/uL (ref 0.0–0.5)
Eosinophils Relative: 1 %
HCT: 41.8 % (ref 39.0–52.0)
Hemoglobin: 13.6 g/dL (ref 13.0–17.0)
Immature Granulocytes: 0 %
Lymphocytes Relative: 19 %
Lymphs Abs: 1.1 10*3/uL (ref 0.7–4.0)
MCH: 28.2 pg (ref 26.0–34.0)
MCHC: 32.5 g/dL (ref 30.0–36.0)
MCV: 86.7 fL (ref 80.0–100.0)
Monocytes Absolute: 0.5 10*3/uL (ref 0.1–1.0)
Monocytes Relative: 9 %
Neutro Abs: 4.2 10*3/uL (ref 1.7–7.7)
Neutrophils Relative %: 71 %
Platelet Count: 214 10*3/uL (ref 150–400)
RBC: 4.82 MIL/uL (ref 4.22–5.81)
RDW: 14.8 % (ref 11.5–15.5)
WBC Count: 6 10*3/uL (ref 4.0–10.5)
nRBC: 0 % (ref 0.0–0.2)

## 2021-11-19 LAB — CMP (CANCER CENTER ONLY)
ALT: 13 U/L (ref 0–44)
AST: 13 U/L — ABNORMAL LOW (ref 15–41)
Albumin: 3.8 g/dL (ref 3.5–5.0)
Alkaline Phosphatase: 75 U/L (ref 38–126)
Anion gap: 3 — ABNORMAL LOW (ref 5–15)
BUN: 14 mg/dL (ref 8–23)
CO2: 29 mmol/L (ref 22–32)
Calcium: 9.1 mg/dL (ref 8.9–10.3)
Chloride: 104 mmol/L (ref 98–111)
Creatinine: 1.05 mg/dL (ref 0.61–1.24)
GFR, Estimated: >60 mL/min (ref >60)
Glucose, Bld: 111 mg/dL — ABNORMAL HIGH (ref 70–99)
Potassium: 4.1 mmol/L (ref 3.5–5.1)
Sodium: 136 mmol/L (ref 135–145)
Total Bilirubin: 1 mg/dL (ref 0.3–1.2)
Total Protein: 7.3 g/dL (ref 6.5–8.1)

## 2021-11-19 LAB — LACTATE DEHYDROGENASE: LDH: 122 U/L (ref 98–192)

## 2021-11-19 NOTE — Progress Notes (Signed)
Michael Spencer [phone]   [phone]  PROGRESS NOTE  Patient Care Team: Wenda Low, MD as PCP - General (Internal Medicine) Fay Records, MD as PCP - Cardiology (Cardiology) Tat, Eustace Quail, DO as Consulting Physician (Neurology)  Hematological/Oncological History # IgA Kappa Monoclonal Gammopathy of Undetermined Significance.  06/26/2021: SPEP detected M protein measuring 0.5 g/dL.  Immunofixation shows a poorly-defined area of restricted protein mobility is detected and is reactive with IgA and kappa.   07/29/2021: Establish care with Dede Query PA-C 08/12/2021: bone marrow biopsy shows 5% plasma cells.    Interval History:  Michael Spencer 76 y.o. male with medical history significant for IgA kappa MGUS who presents for a follow up visit. The patient's last visit was on 07/29/2021 at which time he established care. In the interim since the last visit he underwent a bone marrow biopsy and metastatic survey which confirmed the diagnosis of MGUS  On exam today Michael Spencer is accompanied by his wife.  He reports has been well overall in the interim since her last visit.  He tolerated the bone marrow biopsy without difficulty.  He does not have any residual bleeding, bruising, or pain.  His weight has dropped about 10 pounds in the interim since her last visit.  He reports this is due to the fact he got a new dog and has been walking quite a bit.  He otherwise denies any fevers, chills, sweats, nausea, vomiting or diarrhea.  Full 10 point ROS is listed below.  The bulk of our discussion focused on the diagnosis of MGUS and the steps moving forward.  The patient voiced understanding of the plan.  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   HTN (hypertension) 07/09/2017   Seasonal allergies 07/09/2017   Tinnitus 07/09/2017   Varicose vein of leg 07/09/2017    SURGICAL HISTORY: Past Surgical History:  Procedure Laterality Date   CORONARY ARTERY BYPASS GRAFT     HERNIA REPAIR Right 2005   NOSE SURGERY  1981   TRANSURETHRAL RESECTION OF PROSTATE  1988   VARICOSE VEIN SURGERY Left 2014    SOCIAL HISTORY: Social History   Socioeconomic History   Marital status: Married    Spouse name: Not on file   Number of children: Not on file   Years of education: Not on file   Highest education level: Not on file  Occupational History   Occupation: retired    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, academic  Tobacco Use   Smoking status: Former    Types: Cigarettes   Smokeless tobacco: Never  Substance and Sexual Activity   Alcohol use: Not Currently    Comment: rarely   Drug use: No   Sexual activity: Not on file  Other Topics Concern   Not on file  Social History Narrative   Right handed   Lives with wife    Retired    Investment banker, operational of Radio broadcast assistant Strain: Not on file  Food Insecurity: Not on file  Transportation Needs: Not on file  Physical Activity: Not on file  Stress: Not on file  Social Connections: Not on file  Intimate Partner Violence: Not on file    FAMILY HISTORY: Family History  Problem Relation Age of Onset   Cancer - Other Mother        LIVER CANCER   Cancer Father        PANCREATIC   Diabetes Father    Hypertension Father    CAD Sister    CAD Sister    CAD Brother    Lymphoma Brother     ALLERGIES:  is allergic to percocet [oxycodone-acetaminophen].  MEDICATIONS:  Current Outpatient Medications  Medication Sig Dispense Refill   aspirin EC 81 MG tablet Take 81 mg by mouth daily.     atorvastatin (LIPITOR) 40 MG tablet Take 40 mg at bedtime by mouth.  3   losartan (COZAAR) 25 MG  tablet Take 25 mg daily by mouth.  2   metoprolol tartrate (LOPRESSOR) 25 MG tablet Take 12.5 mg 2 (two) times daily by mouth.  1   No current facility-administered medications for this visit.    REVIEW OF SYSTEMS:   Constitutional: ( - ) fevers, ( - )  chills , ( - ) night sweats Eyes: ( - ) blurriness of vision, ( - ) double vision, ( - ) watery eyes Ears, nose, mouth, throat, and face: ( - ) mucositis, ( - ) sore throat Respiratory: ( - ) cough, ( - ) dyspnea, ( - ) wheezes Cardiovascular: ( - ) palpitation, ( - ) chest discomfort, ( - ) lower extremity swelling Gastrointestinal:  ( - ) nausea, ( - ) heartburn, ( - ) change in bowel habits Skin: ( - ) abnormal skin rashes Lymphatics: ( - ) new lymphadenopathy, ( - ) easy bruising Neurological: ( - ) numbness, ( - ) tingling, ( - ) new weaknesses Behavioral/Psych: ( - ) mood change, ( - ) new changes  All other systems were reviewed with the patient and are negative.  PHYSICAL EXAMINATION:  Vitals:   11/19/21 1027  BP: (!) 145/88  Pulse: (!) 51  Resp: 16  Temp: 98.1 F (36.7 C)  SpO2: 96%   Filed Weights   11/19/21 1027  Weight: 203 lb 11.2 oz (92.4 kg)    GENERAL: Well-appearing elderly Caucasian male, alert, no distress and comfortable SKIN: skin color, texture, turgor are normal, no rashes or significant lesions EYES: conjunctiva are pink and non-injected, sclera clear LUNGS: clear to auscultation and percussion with normal breathing effort HEART: regular rate & rhythm and no murmurs and no lower extremity edema Musculoskeletal: no cyanosis of digits and no clubbing  PSYCH: alert & oriented x 3, fluent speech NEURO: no focal motor/sensory deficits  LABORATORY DATA:  I have reviewed the data as listed    Latest Ref Rng & Units 11/19/2021    9:44 AM 08/12/2021    8:25 AM 07/29/2021    4:08 PM  CBC  WBC 4.0 - 10.5 K/uL 6.0  6.5  5.5   Hemoglobin 13.0 - 17.0 g/dL 13.6  12.8  13.3   Hematocrit 39.0 - 52.0 % 41.8   40.7  42.4   Platelets 150 - 400 K/uL 214  183  232        Latest Ref Rng & Units 11/19/2021    9:44 AM 08/12/2021    8:25 AM 07/29/2021    4:08 PM  CMP  Glucose 70 - 99 mg/dL 111  101  94   BUN 8 - 23 mg/dL _0 Creatinine 0.61 - 1.24  mg/dL 1.05  1.13  0.95   Sodium 135 - 145 mmol/L 136  137  137   Potassium 3.5 - 5.1 mmol/L 4.1  4.9  4.0   Chloride 98 - 111 mmol/L 104  103  103   CO2 22 - 32 mmol/L _0 Calcium 8.9 - 10.3 mg/dL 9.1  9.1  9.2   Total Protein 6.5 - 8.1 g/dL 7.3  6.7  7.7   Total Bilirubin 0.3 - 1.2 mg/dL 1.0  0.9  1.0   Alkaline Phos 38 - 126 U/L 75  73  80   AST 15 - 41 U/L _1 ALT 0 - 44 U/L _2 Lab Results  Component Value Date   MPROTEIN Comment (A) 07/29/2021   Lab Results  Component Value Date   KPAFRELGTCHN 41.2 (H) 07/29/2021   LAMBDASER 30.0 (H) 07/29/2021   KAPLAMBRATIO 9.39 07/31/2021   KAPLAMBRATIO 1.37 07/29/2021    RADIOGRAPHIC STUDIES: No results found.  ASSESSMENT & PLAN Michael Spencer 76 y.o. male with medical history significant for IgA kappa MGUS who presents for a follow up visit.   #IgA Kappa Monoclonal Gammopathy of Undetermined Significance -- At each visit will order an SPEP, SFLC and beta 2 microglobulin --additionally will collect CBC, CMP, and LDH --recommend a metastatic bone survey to assess for lytic lesions at least yearly.  Next due March 2023 --UPEP to be collected yearly, next March 2023 --Bone marrow biopsy performed in March 2023 showed 5% plasma cells.  Findings most consistent with MGUS --Labs today show white blood cell count 6.0, hemoglobin 13.6, platelet count 214, and MCV 86.7.  Creatinine 1.05 --Return to clinic in 6 months time.   No orders of the defined types were placed in this encounter.   All questions were answered. The patient knows to call the clinic with any problems, questions or concerns.  A total of more than 30 minutes were spent on this encounter with  face-to-face time and non-face-to-face time, including preparing to see the patient, ordering tests and/or medications, counseling the patient and coordination of care as outlined above.   Michael Peoples, MD Department of Hematology/Oncology [place] [facility] Phone: [phone] Pager: [phone] Email: [email or web address]  11/19/2021 5:18 PM

## 2021-11-20 LAB — KAPPA/LAMBDA LIGHT CHAINS
Kappa free light chain: 40.1 mg/L — ABNORMAL HIGH (ref 3.3–19.4)
Kappa, lambda light chain ratio: 1.45 (ref 0.26–1.65)
Lambda free light chains: 27.6 mg/L — ABNORMAL HIGH (ref 5.7–26.3)

## 2021-11-24 LAB — MULTIPLE MYELOMA PANEL, SERUM
Albumin SerPl Elph-Mcnc: 3.5 g/dL (ref 2.9–4.4)
Albumin/Glob SerPl: 1.2 (ref 0.7–1.7)
Alpha 1: 0.2 g/dL (ref 0.0–0.4)
Alpha2 Glob SerPl Elph-Mcnc: 0.5 g/dL (ref 0.4–1.0)
B-Globulin SerPl Elph-Mcnc: 1.1 g/dL (ref 0.7–1.3)
Gamma Glob SerPl Elph-Mcnc: 1.1 g/dL (ref 0.4–1.8)
Globulin, Total: 3 g/dL (ref 2.2–3.9)
IgA: 613 mg/dL — ABNORMAL HIGH (ref 61–437)
IgG (Immunoglobin G), Serum: 1309 mg/dL (ref 603–1613)
IgM (Immunoglobulin M), Srm: 74 mg/dL (ref 15–143)
Total Protein ELP: 6.5 g/dL (ref 6.0–8.5)

## 2021-11-26 NOTE — Progress Notes (Signed)
Assessment/Plan:   1.  Pseudobulbar/bulbar speech with pseudobulbar affect  -EMG did not demonstrate fasciculations, but there were chronic neurogenic changes of the hypoglossus muscles.  It was recommended that if symptoms persist that we repeat EMG in 6-8 months.  That EMG was done in January, 2023.  We are going to schedule that to be repeated.  -Reassured him that I saw no evidence of Parkinson's disease today.  I also was not convinced of an atypical state.  Patient's brother does have Parkinson's disease.  -Patient declined second opinion.  I may, however, transfer him to one of my neuromuscular partners following EMG.  We discussed this today.  Patient was agreeable.  2.  MGUS  -Patient now following with oncology. 3.  B12 deficiency  -Was on supplementation.  Repeat was normal and then he stopped it.  Asked him to restart.  Subjective:   Michael Spencer was seen today in the movement disorders clinic for follow-up for his EMG.  Pt with wife and daughter who supplements hx. lab work done last visit demonstrated IgA kappa monoclonal gammopathy.  Patient was referred to oncology.  Patient had bone marrow biopsy.  Patient diagnosed with MGUS.  He also had a low B12.  He is now on supplementation.  Oncology repeated this and it was normal.  MRI of the cervical spine has been completed since last visit.  While there was evidence of moderate to severe neuroforaminal stenosis on the left at C5-C6 and multilevel degenerative changes, there was nothing to explain his presenting complaint.  Pt states that he has had no new sx's.  Daughter notes "episodes" of slurring of speech.  No falls.  No weakness of going up/down stairs.  No choking on food.      ALLERGIES:   Allergies  Allergen Reactions   Percocet [Oxycodone-Acetaminophen] Other (See Comments)    hallucinations    CURRENT MEDICATIONS:  Current Outpatient Medications  Medication Instructions   aspirin EC 81 mg, Oral, Daily    atorvastatin (LIPITOR) 40 mg, Oral, Daily at bedtime   losartan (COZAAR) 25 mg, Oral, Daily   metoprolol tartrate (LOPRESSOR) 12.5 mg, Oral, 2 times daily    Objective:   VITALS:   Vitals:   11/28/21 1030  BP: 120/74  Pulse: (!) 57  SpO2: 98%  Weight: 203 lb (92.1 kg)  Height: 6' 1.5" (1.867 m)       GEN:  The patient appears stated age and is in NAD.   HEENT:  Normocephalic, atraumatic.  The mucous membranes are moist. The superficial temporal arteries are without ropiness or tenderness.  No tongue fasciculations.  He does have a geographic tongue.  There is some tongue atrophy.  This is same as previous. CV: Bradycardic.  Regular. Lungs:  CTAB Neck/HEME:  There are no carotid bruits bilaterally.  Neurological examination:  Orientation: The patient is alert and oriented x3.  Cranial nerves: There is good facial symmetry. Extraocular muscles are intact.  There is no upgaze or downgaze paresis.  He does have some trouble with smooth pursuit.  The visual fields are full to confrontational testing. The speech is fluent and pseudobulbar in quality.  He has trouble with the guttural sounds. Soft palate rises symmetrically and there is no tongue deviation. Hearing is intact to conversational tone. Sensation: Sensation is intact to light throughout.   Motor: Strength is 5/5 in the bilateral upper and lower extremities.   Shoulder shrug is equal and symmetric.  There is no pronator drift.  There were no fasciculations across the chest, back, arms or legs.  There were no tongue fasciculations.  This is same as previous. Deep tendon reflexes: Deep tendon reflexes are 2/4 at the bilateral biceps, triceps, brachioradialis, patella and achilles.  He is not hyperreflexic.  Plantar responses are downgoing bilaterally.  Movement examination: Tone: There is normal tone in the bilateral upper extremities.  The tone in the lower extremities is normal.  Abnormal movements: None Coordination:  There  is no decremation with RAM's, with any form of RAMS, including alternating supination and pronation of the forearm, hand opening and closing, finger taps, heel taps and toe taps. Gait and station.  Ambulates fairly well, but appears to have a bit of foot drop on the right with ambulation.    Total time spent on today's visit was 64mnutes, including both face-to-face time and nonface-to-face time.  Time included that spent on review of records (prior notes available to me/labs/imaging if pertinent), discussing treatment and goals, answering patient's questions and coordinating care.  Cc:  HWenda Low MD

## 2021-11-28 ENCOUNTER — Encounter: Payer: Self-pay | Admitting: Neurology

## 2021-11-28 ENCOUNTER — Ambulatory Visit (INDEPENDENT_AMBULATORY_CARE_PROVIDER_SITE_OTHER): Payer: Medicare Other | Admitting: Neurology

## 2021-11-28 VITALS — BP 120/74 | HR 57 | Ht 73.5 in | Wt 203.0 lb

## 2021-11-28 DIAGNOSIS — D472 Monoclonal gammopathy: Secondary | ICD-10-CM | POA: Diagnosis not present

## 2021-11-28 DIAGNOSIS — G1220 Motor neuron disease, unspecified: Secondary | ICD-10-CM | POA: Diagnosis not present

## 2022-01-01 ENCOUNTER — Ambulatory Visit (INDEPENDENT_AMBULATORY_CARE_PROVIDER_SITE_OTHER): Payer: Medicare Other | Admitting: Neurology

## 2022-01-01 DIAGNOSIS — G1220 Motor neuron disease, unspecified: Secondary | ICD-10-CM | POA: Diagnosis not present

## 2022-01-01 NOTE — Procedures (Signed)
[facility]  [place], [place]  [place],  [address] Tel: [phone] Fax:  [phone] Test Date:  01/01/2022  Patient: Michael Spencer DOB: Feb 25, 1946 Physician: Narda Amber, DO  Sex: Male Height: '6\' 1"'$  Ref Phys: Alonza Bogus, D.O.  ID#: [id number]   Technician:    Patient Complaints: This is a 76 year old man referred for evaluation of dysarthria and pseudobulbar affect.  NCV & EMG Findings: Extensive electrodiagnostic testing of the right upper and lower extremities and additional studies of the bulbar muscles shows:  Right ulnar sensory response shows prolonged latency (3.3 ms). Right median, sural, and superficial peroneal sensory responses are within normal limits. Right ulnar motor response shows slowed conduction velocity across the elbow (A Elbow-B Elbow, 42 m/s).  Right median, ulnar, peroneal, and tibial motor responses are within normal limits. Right tibial H reflex studies within normal limits. In the right upper extremity, chronic motor axonal loss changes are seen affecting the pronator teres and triceps muscles, without accompanying active denervation. In the right lower extremity, there is no evidence of active or chronic motor axonal loss changes affecting any of the tested extremity. Sparse chronic motor axon loss changes are seen in the hyoglossus muscle without accompanied active denervation. No fasciculations were seen in any of the tested muscles.   Impression: As compared to prior study on 06/24/2021, there has been no change significant change.  Findings show: Chronic C7 radiculopathy affecting the right upper extremity, mild and unchanged. In isolation, mild chronic neurogenic changes involving the hyoglossus muscle is of unclear clinical significance.  Right ulnar neuropathy with slowing across the elbow, demyelinating, mild, is new.  There is no evidence of sensorimotor neuropathy, wide spread disorder of anterior horn cells, or diffuse  myopathy.   ___________________________ Narda Amber, DO    Nerve Conduction Studies Anti Sensory Summary Table   Stim Site NR Peak (ms) Norm Peak (ms) P-T Amp (V) Norm P-T Amp  Right Median Anti Sensory (2nd Digit)  32C  Wrist    3.5 <3.8 22.9 >10  Right Sup Peroneal Anti Sensory (Ant Lat Mall)  32C  12 cm    2.7 <4.6 7.1 >3  Right Sural Anti Sensory (Lat Mall)  32C  Calf    3.2 <4.6 11.1 >3  Right Ulnar Anti Sensory (5th Digit)  32C  Wrist    3.3 <3.2 9.1 >5   Motor Summary Table   Stim Site NR Onset (ms) Norm Onset (ms) O-P Amp (mV) Norm O-P Amp Site1 Site2 Delta-0 (ms) Dist (cm) Vel (m/s) Norm Vel (m/s)  Right Median Motor (Abd Poll Brev)  32C  Wrist    3.3 <4.0 7.1 >5 Elbow Wrist 6.0 35.0 58 >50  Elbow    9.3  6.1         Right Peroneal Motor (Ext Dig Brev)  32C  Ankle    4.1 <6.0 3.2 >2.5 B Fib Ankle 9.6 39.0 41 >40  B Fib    13.7  2.8  Poplt B Fib 2.3 10.0 43 >40  Poplt    16.0  2.8         Right Tibial Motor (Abd Hall Brev)  32C  Ankle    4.1 <6.0 6.2 >4 Knee Ankle 11.1 46.0 41 >40  Knee    15.2  3.8         Right Ulnar Motor (Abd Dig Minimi)  32C  Wrist    2.8 <3.1 9.5 >7 B Elbow Wrist 4.5 25.0 56 >50  B Elbow    7.3  9.3  A Elbow B Elbow 2.4 10.0 42 >50  A Elbow    9.7  8.8          H Reflex Studies   NR H-Lat (ms) Lat Norm (ms) L-R H-Lat (ms)  Right Tibial (Gastroc)  32C     34.69 <35    EMG   Side Muscle Ins Act Fibs Psw Fasc Number Recrt Dur Dur. Amp Amp. Poly Poly. Comment  Right AntTibialis Nml Nml Nml Nml Nml Nml Nml Nml Nml Nml Nml Nml N/A  Right Mentalis Nml Nml Nml Nml Nml Nml Nml Nml Nml Nml Nml Nml N/A  Right Hyoglossus Nml Nml Nml Nml 1- Rapid Some 1+ Some 1+ Some 1+ N/A  Right 1stDorInt Nml Nml Nml Nml Nml Nml Nml Nml Nml Nml Nml Nml N/A  Right PronatorTeres Nml Nml Nml Nml 1- Rapid Some 1+ Some 1+ Some 1+ N/A  Right Biceps Nml Nml Nml Nml Nml Nml Nml Nml Nml Nml Nml Nml N/A  Right Triceps Nml Nml Nml Nml 1- Rapid Some 1+ Some 1+  Some 1+ N/A  Right Deltoid Nml Nml Nml Nml Nml Nml Nml Nml Nml Nml Nml Nml N/A  Right ABD Dig Min Nml Nml Nml Nml Nml Nml Nml Nml Nml Nml Nml Nml N/A  Right FlexCarpiUln Nml Nml Nml Nml Nml Nml Nml Nml Nml Nml Nml Nml N/A  Right Gastroc Nml Nml Nml Nml Nml Nml Nml Nml Nml Nml Nml Nml N/A  Right RectFemoris Nml Nml Nml Nml Nml Nml Nml Nml Nml Nml Nml Nml N/A  Right BicepsFemS Nml Nml Nml Nml Nml Nml Nml Nml Nml Nml Nml Nml N/A  Right GluteusMed Nml Nml Nml Nml Nml Nml Nml Nml Nml Nml Nml Nml N/A  Right Lumbo Parasp Low Nml Nml Nml Nml Nml Nml Nml Nml Nml Nml Nml Nml N/A  Right T7 Parasp Nml Nml Nml Nml Nml Nml Nml Nml Nml Nml Nml Nml N/A  Right T11 Parasp Nml Nml Nml Nml Nml Nml Nml Nml Nml Nml Nml Nml N/A      Waveforms:

## 2022-01-13 DIAGNOSIS — G4733 Obstructive sleep apnea (adult) (pediatric): Secondary | ICD-10-CM | POA: Diagnosis not present

## 2022-01-13 DIAGNOSIS — E538 Deficiency of other specified B group vitamins: Secondary | ICD-10-CM | POA: Diagnosis not present

## 2022-01-13 DIAGNOSIS — I10 Essential (primary) hypertension: Secondary | ICD-10-CM | POA: Diagnosis not present

## 2022-01-13 DIAGNOSIS — I25810 Atherosclerosis of coronary artery bypass graft(s) without angina pectoris: Secondary | ICD-10-CM | POA: Diagnosis not present

## 2022-01-13 DIAGNOSIS — D472 Monoclonal gammopathy: Secondary | ICD-10-CM | POA: Diagnosis not present

## 2022-01-13 DIAGNOSIS — E7800 Pure hypercholesterolemia, unspecified: Secondary | ICD-10-CM | POA: Diagnosis not present

## 2022-01-13 DIAGNOSIS — N400 Enlarged prostate without lower urinary tract symptoms: Secondary | ICD-10-CM | POA: Diagnosis not present

## 2022-02-17 NOTE — Progress Notes (Unsigned)
Initial neurology clinic note  SERVICE DATE: 11/18/21 SERVICE TIME: 2:30 pm  Reason for Evaluation: Consultation requested by Michael Low, MD for an opinion regarding pseudobulbar speech and affect. My final recommendations will be communicated back to the requesting physician by way of shared medical record or letter to requesting physician via [place] mail.  HPI: This is Mr. Michael Spencer, a 76 y.o. right-handed male with a medical history of HLD, HTN, CAD s/p CABG, deviated septum s/p surgical correction who presents to neurology clinic with the chief complaint of slurred speech. The patient is accompanied by wife, Melony.  Patient has had slurred speech for about 1 year. It has slowly progressed since 1 year ago. He also does not have as good of balance as he used to have. Per patient and family, his symptoms fluctuate. He can have days that there is not slurred speech. There is no clear pattern or triggers. His imbalance does not fluctuate. He sometimes has difficulty getting words out when talking. He will know the word but cannot say it.  Muscle bulk loss? Not clearly losing muscle He has cramping in his calves. He does not have significant twitching. He has some sensory changes in bilateral feet. He denies significant pain. Suggestion of myotonia/difficulty relaxing after contraction? No  Fatigable weakness? No Does strength improve after brief exercise? No  Able to brush hair/teeth without difficulty? Yes  Able to button shirts/use zips? Yes  Clumsiness/dropping grasped objects? No Can you arise from squatted position easily? Yes  Able to get out of chair without using arms? Yes  Able to walk up steps easily? Yes Use an assistive device to walk? No  Significant imbalance with walking? Yes  Falls? No  Any change in urine color, especially after exertion/physical activity? No  He endorses occasional double vision that will resolve with covering one eye (once every 2 weeks). He  can squint and have the symptoms resolve. He denies ptosis.  He denies difficulty chewing. He endorses occasional choking when drinking water in the morning. Wife thinks patient is coughing or choking more often.  There are no neuromuscular respiratory weakness symptoms, particularly orthopnea>dyspnea.   Pseudobulbar affect is present. This was present prior to the slurred speech, perhaps 1-2 years in duration.  The patient does not report symptoms referable to autonomic dysfunction including impaired sweating, excessive mucosal dryness, gastroparetic early satiety, postprandial abdominal bloating, constipation, bowel or bladder dyscontrol, or syncope/presyncope/orthostatic intolerance.  He endorses cold intolerance. He gets cold quicker than wife.  The patient has not noticed any recent skin rashes nor does he report any constitutional symptoms like fever, night sweats. He has lost 20 lbs over the last year. Per wife, they had a new dog over the same period in which the patient is walking the dog 3 times per day. They think this is the reason for the weight loss.  EtOH use: Rare  Restrictive diet? No Family history of neuropathy/myopathy/NM disease? Brother with Parkinson's disease. Father had cerebellar degeneration (he would fall when walking, no voice changes; he was an alcoholic)  Patient was previously seen in this office by Dr. Carles Collet, initially on 05/20/2021. History per that clinic note: "Michael Spencer was seen today in the movement disorders clinic for neurologic consultation at the request of Michael Low, MD.  The consultation is for the evaluation of balance difficulty, gait change and to rule out Parkinson's disease.  Medical records made available to me are reviewed.  Patient actually feels that that gait and balance  change have been getting better. Patient feels that biggest issue is that he was having intermittent speech change.  Patient states that he would have episodes where he  knew what he would like to say but could not get the proper words out.  Each episode would last between 1 and 5 minutes per medical records made available to me..   Today, patient and family state that sx's started 3 months ago ago.  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  That has actually gotten better and it is not there any longer per pt but daughter isn't convinced it is gone.  No falls.  About 1.5 months ago, he noted slow onset of speech change.   He states that speech change comes and goes.  It lasts a max of 5 min per wife.  Speech change consists of slurred speech.  He denies trouble with word finding but daughter states that he has had episodes of word finding trouble.  He doesn't know what would bring on an episodes.   Specific Symptoms:  Tremor: No. Family hx of similar:  Yes.   brother with Parkinsons Disease ; father with "degeneration of the cerebellum" (? SCA) Voice: weak/soft Sleep: sleeps well             Vivid Dreams:  No.             Acting out dreams:  No. Wet Pillows: No. Postural symptoms:  Yes.               Falls?  No. Bradykinesia symptoms: difficulty getting out of a chair; no shuffling; no drooling Loss of smell:  No. Loss of taste:  No. Urinary Incontinence:  No. Difficulty Swallowing:  No. Handwriting, micrographia: No. Trouble with ADL's:  No.             Trouble buttoning clothing: No. Depression:  pt states normal; wife states little depressed Memory changes:  some short term changes Hallucinations:  No.             visual distortions: No. N/V:  No. Lightheaded:  No.             Syncope: No. Diplopia:  none now - had in remote past - perhaps over year ago Dyskinesia:  Yes.     MRI brain was completed on May 09, 2021.  There were chronic lacunar infarctions in the right cerebellum, left corona radiata and left lentiform nucleus."  Work up has included AChR ab (neg), MRI brain w/wo  with small vessel disease and lacunar infarcts in left corona radiata, lentiform nucleus, and right cerebellum. EMG was completed on 06/24/21 that showed chronic right C7 radiculopathy without more diffuse process. Repeat EMG on 01/01/22 was similar without evidence of motor neuron disease. M protein was found on immunofixation. Patient saw oncology who did a bone marrow biopsy and diagnosed patient with MGUS. He was also put on B12 supplementation for deficiency.  MEDICATIONS:  Outpatient Encounter Medications as of 02/18/2022  Medication Sig   aspirin EC 81 MG tablet Take 81 mg by mouth daily.   atorvastatin (LIPITOR) 40 MG tablet Take 40 mg at bedtime by mouth.   losartan (COZAAR) 25 MG tablet Take 25 mg daily by mouth.   metoprolol tartrate (LOPRESSOR) 25 MG tablet Take 12.5 mg 2 (two) times daily by mouth.   No facility-administered encounter medications on file as of 02/18/2022.    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   HTN (hypertension) 07/09/2017   Seasonal allergies 07/09/2017   Tinnitus 07/09/2017   Varicose vein of leg 07/09/2017    PAST SURGICAL HISTORY: Past Surgical History:  Procedure Laterality Date   CORONARY ARTERY BYPASS GRAFT     HERNIA REPAIR Right 2005   NOSE SURGERY  1981   TRANSURETHRAL RESECTION OF PROSTATE  1988   VARICOSE VEIN SURGERY Left 2014    ALLERGIES: Allergies  Allergen Reactions   Percocet [Oxycodone-Acetaminophen] Other (See Comments)    hallucinations    FAMILY HISTORY: Family History  Problem Relation Age of Onset   Cancer - Other Mother        LIVER CANCER   Cancer Father        PANCREATIC   Diabetes Father    Hypertension Father     CAD Sister    CAD Sister    CAD Brother    Lymphoma Brother     SOCIAL HISTORY: Social History   Tobacco Use   Smoking status: Former    Types: Cigarettes   Smokeless tobacco: Never  Vaping Use   Vaping Use: Never used  Substance Use Topics   Alcohol use: Not Currently    Comment: rarely   Drug use: No   Social History   Social History Narrative   Right handed   Lives with wife one story home   Retired    No Caffeine     OBJECTIVE: PHYSICAL EXAM: BP (!) 158/84   Pulse (!) 57   Ht 6' 1"  (1.854 m)   Wt 204 lb (92.5 kg)   SpO2 94%   BMI 26.91 kg/m   General: General appearance: Awake and alert. No distress. Cooperative with exam.  Skin: No obvious rash or jaundice. HEENT: Atraumatic. Anicteric. Lungs: Non-labored breathing on room air  Extremities: No edema. No obvious deformity.  Musculoskeletal: No obvious joint swelling. Psych: Pseudobulbar affect  Neurological: Mental Status: Alert. Speech fluent. Positive for pseudobulbar affect Cranial Nerves: CNII: No RAPD. Visual fields intact. CNIII, IV, VI: PERRL. No nystagmus. EOMI. Patient reports diplopia with sustained up gaze (after about 20 seconds) CN V: Facial sensation intact bilaterally to fine touch. Masseter clench strong. Jaw jerk negative. CN VII: Facial muscles symmetric and strong. No ptosis at rest or after sustained up gaze. CN VIII: Hears finger rub well bilaterally. CN IX: No hypophonia. CN X: Palate elevates symmetrically. CN XI: Full strength shoulder shrug bilaterally. CN XII: Tongue protrusion full and midline. No atrophy or fasciculations. Mild mixed dysarthria Motor: Tone is normal. No fasciculations in any of his extremities. No significant atrophy.  Individual muscle group testing (MRC grade out of 5):  Movement     Neck flexion 5    Neck extension 5     Right Left   Shoulder abduction 5 5   Shoulder adduction 5 5   Shoulder ext rotation 5 5   Shoulder int rotation 5 5    Elbow flexion 5 5   Elbow extension 5 5   Wrist extension 5 5   Wrist flexion 5 5   Finger abduction - FDI 5 5   Finger abduction -  ADM 5 5   Finger extension 5 5   Finger distal flexion - 2/3 5 5    Finger distal flexion - 4/5 5 5    Thumb flexion - FPL 5 5   Thumb abduction - APB 5 5    Hip flexion 5 5   Hip extension 5 5   Hip adduction 5 5   Hip abduction 5 5   Knee extension 5 5   Knee flexion 5 5   Dorsiflexion 5 5   Plantarflexion 5 5   Inversion 5 5   Eversion 5 5   Great toe extension 5 5   Great toe flexion 5 5     Reflexes:  Right Left   Bicep 2+ 2+   Tricep 2+ 2+   BrRad 2+ 2+   Knee 2+ 2+   Ankle 1+ 1+    Pathological Reflexes: Babinski: flexor response bilaterally Hoffman: absent bilaterally Troemner: absent bilaterally Pectoral: present bilaterally Palmomental: negative Facial: positive on left, negative on right Midline tap: positive Sensation: Pinprick: Intact in bilateral upper extremities. 25% in right foot (to knee), 50% in left foot (to calf) Vibration: Intact in bilateral upper extremities. 2-3 seconds in bilateral great toes Proprioception: Absent at bilateral great toes Coordination: Intact finger-to- nose-finger bilaterally. Romberg negative. Difficulty with RAM in left hand and left foot. Gait: Able to rise from chair with arms crossed unassisted. Difficulty when standing in place (swayed with eyes open). Normal, narrow-based gait. Unable to tandem walk (falls to right). Able to walk on toes and heels.  Lab and Test Review: Internal labs: Normal or unremarkable: lyme, heavy metals, PTH, copper, TSH, LDH MG panel (05/20/21): negative IFE and SPEP (06/26/21): poorly defined region of restricted mobility (IgA kappa) B12 (06/26/21): 231; repeat on 07/29/21: 582 with normal MMA   MRI cervical spine wo contrast (07/14/21): FINDINGS: Alignment: Straightening and slight reversal of the normal cervical lordosis. Trace anterolisthesis at  C2-C3.   Vertebrae: No fracture, evidence of discitis, or bone lesion. Small hemangioma at base of the dens.   Cord: Normal signal and morphology.   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 Otherwise negative.   Disc levels:   C2-C3: Negative disc. Moderate right and mild left facet arthropathy. No stenosis.   C3-C4: Tiny shallow broad-based posterior disc protrusion slightly eccentric to the right. Mild right uncovertebral hypertrophy. Moderate bilateral facet arthropathy. Mild right neuroforaminal stenosis. No spinal canal or left neuroforaminal stenosis.   C4-C5: Negative disc. Moderate right and mild left facet arthropathy. No stenosis.   C5-C6: Small posterior disc osteophyte complex eccentric to the left. Left greater than right facet uncovertebral hypertrophy. Slight flattening of the left ventral cord with borderline mild spinal canal stenosis. Moderate to severe left neuroforaminal stenosis. No right neuroforaminal stenosis.   C6-C7: Small posterior disc osteophyte complex and mild bilateral facet uncovertebral hypertrophy. Borderline mild spinal canal stenosis. No neuroforaminal stenosis.   C7-T1: Negative disc. Moderate bilateral facet arthropathy. No stenosis.   IMPRESSION: 1. Multilevel degenerative changes of the cervical spine as described above. Moderate to severe left neuroforaminal stenosis at C5-C6. No significant spinal canal stenosis at any level.  MRI brain w/wo contrast (05/09/21): FINDINGS: Brain: Cerebral volume is within normal limits for age. No restricted diffusion to suggest acute infarction. No midline shift, mass effect, evidence of mass lesion, ventriculomegaly, extra-axial collection or acute intracranial hemorrhage. Cervicomedullary junction and pituitary are within normal limits.   There is a  small chronic lacunar infarct tracking from the  left corona radiata to the left lentiform. This is facilitated on diffusion. And there is a small chronic infarct in the posterior cerebellum on the right (series 13, image 6). But elsewhere generally normal for age gray and white matter signal throughout the brain. No cortical encephalomalacia or chronic cerebral blood products identified. There is minimal T2 heterogeneity in the right pons.   No abnormal enhancement identified (minimal central pontine capillary telangiectasia, normal variant). No dural thickening.   Vascular: Major intracranial vascular flow voids are preserved. The distal left vertebral artery appears to be dominant. The major dural venous sinuses are enhancing and appear to be patent.   Skull and upper cervical spine: Negative for age visible cervical spine. Visualized bone marrow signal is within normal limits.   Sinuses/Orbits: Negative orbits. Only trace paranasal sinus mucosal thickening.   Other: Mastoid air cells are clear. Visible internal auditory structures appear normal. Negative visible scalp and face.   IMPRESSION: 1. No acute or subacute intracranial abnormality identified. 2. There are small chronic small vessel/lacunar type infarcts in the left corona radiata and lentiform, and right cerebellum.  EMG 06/24/21: Impression: Chronic C7 radiculopathy affecting the right upper extremity, mild. In isolation, mild chronic neurogenic changes involving the hyoglossus muscle are of unclear clinical significance.  Repeat electrodiagnostic testing in 6 months, if clinically indicated. There is no evidence of sensorimotor neuropathy, wide spread disorder of anterior horn cells, or diffuse myopathy.  EMG 01/01/22: Impression: As compared to prior study on 06/24/2021, there has been no change significant change.  Findings show: Chronic C7 radiculopathy affecting the right upper extremity, mild and unchanged. In isolation, mild chronic neurogenic changes  involving the hyoglossus muscle is of unclear clinical significance.  Right ulnar neuropathy with slowing across the elbow, demyelinating, mild, is new.  There is no evidence of sensorimotor neuropathy, wide spread disorder of anterior horn cells, or diffuse myopathy.   ASSESSMENT: Heather Streeper is a 76 y.o. male who presents for evaluation of dysarthria and pseudobulbar affect. He has a relevant medical history of HLD, HTN, CAD s/p CABG, deviated septum s/p surgical correction. His neurological examination is pertinent for diplopia with sustained up gaze, mild mixed dysarthria, pseudobulbar affect, and diminished sensation to all modalities in bilateral distal lower extremities. Available diagnostic data is significant for borderline Spencer B12, EMG x2 with no evidence of motor neuron disease or significant neuropathy (chronic right C7 radiculopathy). MRI brain showed lacunar infarcts in left corona radiata and lentiform nucleus and right cerebellum.  The etiology of patient's symptoms is currently unclear. Most of patient's symptoms are consistent with an upper motor neuron process, though his dysarthria seems to have mixed features. While he has cerebellar pathology on MRI brain (lacunar infarct), I am doubtful that this would explain all of his symptoms. He does not have evidence of upper or lower motor neuron changes in his limbs to suggest ALS, however, this could be bulbar onset ALS. Given no significant changes over 6 months on EMG, this is not clearly the case though. PLS is also possible. Myasthenia gravis would be unlikely to explain of his symptoms, however, given the fluctuating nature of symptoms as reported by patient and family, it is reasonable to check antibodies today. I explained that long term follow up and monitoring is needed to better determine etiology of symptoms.  His sensory loss in bilateral distal lower extremities may be related to monoclonal gammopathy or borderline B12, but is  likely unrelated  to bulbar symptoms.  MND? PLS? Cerebellar?  PLAN: -Blood work: AChR abs (binding, blocking, modulating) -Modified barium swallow to assess swallowing function -Speech therapy referral -Trial of Nuedexta (sample given today) - 1 tablet daily (20/10 mg). Patient will let me know if it helps and if he wants to continue it -Offered second opinion at academic center such as [place], etc. Patient and family will consider it. -Patient instructed to call or message if having new or worsening symptoms  -Return to clinic in 6 months  The impression above as well as the plan as outlined below were extensively discussed with the patient (in the company of wife and daughter, Maudie Mercury, who joined by facetime) who voiced understanding. All questions were answered to their satisfaction.  When available, results of the above investigations and possible further recommendations will be communicated to the patient via telephone/MyChart. Patient to call office if not contacted after expected testing turnaround time.   Total time spent reviewing records, interview, history/exam, documentation, and coordination of care on day of encounter:  100 min   Thank you for allowing me to participate in patient's care.  If I can answer any additional questions, I would be pleased to do so.  Kai Levins, MD   CC: Michael Low, MD [address] [place] [address]  CC: Referring provider: Wenda Low, MD [address] [place],  [place] [address]

## 2022-02-18 ENCOUNTER — Encounter: Payer: Self-pay | Admitting: Neurology

## 2022-02-18 ENCOUNTER — Ambulatory Visit (INDEPENDENT_AMBULATORY_CARE_PROVIDER_SITE_OTHER): Payer: Medicare Other | Admitting: Neurology

## 2022-02-18 VITALS — BP 158/84 | HR 57 | Ht 73.0 in | Wt 204.0 lb

## 2022-02-18 DIAGNOSIS — R4781 Slurred speech: Secondary | ICD-10-CM

## 2022-02-18 DIAGNOSIS — F482 Pseudobulbar affect: Secondary | ICD-10-CM

## 2022-02-18 DIAGNOSIS — R471 Dysarthria and anarthria: Secondary | ICD-10-CM | POA: Diagnosis not present

## 2022-02-18 MED ORDER — NUEDEXTA 20-10 MG PO CAPS: 1.0000 | ORAL_CAPSULE | Freq: Every day | ORAL | 0 refills | Status: DC

## 2022-02-18 NOTE — Patient Instructions (Addendum)
I saw you today for slurred speech and emotional changes (pseudobulbar affect). I am not sure the cause of your symptoms.  I don't currently see evidence of Parkinsons, ALS, or PLS (similar to ALS). I don't see clear evidence of cerebellar degeneration either. We will need to follow you closely to monitor your symptoms. The cause may become clear over time.  Today, I would like to get some lab work to make sure you don't have myasthenia gravis. This wouldn't explain all your symptoms, but if you have intermittent slurred speech and double vision, we should check to be sure.  I would like you to have a swallow evaluation with a modified barium swallow test to make sure your swallowing is okay.  I am referring you to speech therapy.  I am also giving you a trial of Nuedexta. This medication is for the pseudobulbar affect, but may help with speech. If it helps, it usually does so within a couple of weeks.  I would like to see you back in clinic in 6 months or sooner if needed. If you have new or worsening symptoms, I want to know.  I will be in touch when I have the results of your labs.  The physicians and staff at [facility] are committed to providing excellent care. You may receive a survey requesting feedback about your experience at our office. We strive to receive "very good" responses to the survey questions. If you feel that your experience would prevent you from giving the office a "very good " response, please contact our office to try to remedy the situation. We may be reached at [phone]. Thank you for taking the time out of your busy day to complete the survey.  Kai Levins, MD [facility]

## 2022-02-19 ENCOUNTER — Other Ambulatory Visit (INDEPENDENT_AMBULATORY_CARE_PROVIDER_SITE_OTHER): Payer: Medicare Other

## 2022-02-19 ENCOUNTER — Other Ambulatory Visit (HOSPITAL_COMMUNITY): Payer: Self-pay

## 2022-02-19 DIAGNOSIS — R059 Cough, unspecified: Secondary | ICD-10-CM

## 2022-02-19 DIAGNOSIS — R471 Dysarthria and anarthria: Secondary | ICD-10-CM

## 2022-02-19 DIAGNOSIS — R4781 Slurred speech: Secondary | ICD-10-CM

## 2022-02-19 DIAGNOSIS — F482 Pseudobulbar affect: Secondary | ICD-10-CM

## 2022-02-19 DIAGNOSIS — R1310 Dysphagia, unspecified: Secondary | ICD-10-CM

## 2022-02-19 NOTE — Addendum Note (Signed)
Addended by: Renae Gloss on: 02/19/2022 09:05 AM   Modules accepted: Orders

## 2022-02-24 ENCOUNTER — Ambulatory Visit (HOSPITAL_COMMUNITY)
Admission: RE | Admit: 2022-02-24 | Discharge: 2022-02-24 | Disposition: A | Discharge status: Home / Self Care | Payer: Medicare Other | Source: Ambulatory Visit | Attending: Internal Medicine | Admitting: Internal Medicine

## 2022-02-24 ENCOUNTER — Encounter: Payer: Self-pay | Admitting: Neurology

## 2022-02-24 DIAGNOSIS — R059 Cough, unspecified: Secondary | ICD-10-CM | POA: Diagnosis not present

## 2022-02-24 DIAGNOSIS — M47812 Spondylosis without myelopathy or radiculopathy, cervical region: Secondary | ICD-10-CM | POA: Insufficient documentation

## 2022-02-24 DIAGNOSIS — F482 Pseudobulbar affect: Secondary | ICD-10-CM

## 2022-02-24 DIAGNOSIS — R1310 Dysphagia, unspecified: Secondary | ICD-10-CM | POA: Insufficient documentation

## 2022-02-24 DIAGNOSIS — R471 Dysarthria and anarthria: Secondary | ICD-10-CM

## 2022-02-24 DIAGNOSIS — K141 Geographic tongue: Secondary | ICD-10-CM | POA: Diagnosis not present

## 2022-02-24 DIAGNOSIS — R4781 Slurred speech: Secondary | ICD-10-CM

## 2022-02-24 DIAGNOSIS — D472 Monoclonal gammopathy: Secondary | ICD-10-CM | POA: Diagnosis not present

## 2022-02-24 DIAGNOSIS — M4802 Spinal stenosis, cervical region: Secondary | ICD-10-CM | POA: Insufficient documentation

## 2022-02-24 LAB — MYASTHENIA GRAVIS PANEL 1
A CHR BINDING ABS: <0.3 nmol/L
STRIATED MUSCLE AB SCREEN: NEGATIVE

## 2022-02-24 NOTE — Progress Notes (Addendum)
Modified Barium Swallow Progress Note  Patient Details  Name: Michael Spencer MRN: [id number] Date of Birth: 28-Nov-1945  Today's Date: 02/24/2022  Modified Barium Swallow completed.  Full report located under Chart Review in the Imaging Section.  Brief recommendations include the following:  Clinical Impression  Pt demonstrates no oral or oropharyngeal dysphagia. No penetration, aspiration or residual. Pts speech was slurred throughout assessment. Geographic tongue and slight left lingual deviation noted. Pt safe to continue a regular diet and thin liquids. Encouraged appt with SLP for dysarthria interventions. No f/u needed for swallowing at this time.   Swallow Evaluation Recommendations       SLP Diet Recommendations: Regular solids;Thin liquid   Liquid Administration via: Cup;Straw   Medication Administration: Whole meds with liquid   Supervision: Patient able to self feed                    Cashlynn Yearwood, Katherene Ponto 02/24/2022,1:53 PM

## 2022-02-25 ENCOUNTER — Encounter: Payer: Self-pay | Admitting: Neurology

## 2022-02-26 NOTE — Therapy (Signed)
OUTPATIENT SPEECH LANGUAGE PATHOLOGY EVALUATION   Patient Name: Michael Spencer MRN: [id number] DOB:Mar 21, 1946, 76 y.o., male Today's Date: 02/27/2022  PCP: Wenda Low, MD REFERRING PROVIDER: Shellia Carwin, MD   End of Session - 02/27/22 1234     Visit Number 1    Number of Visits 17    Date for SLP Re-Evaluation 04/24/22    Authorization Type Medicare    SLP Start Time 1018    SLP Stop Time  1102    SLP Time Calculation (min) 44 min    Activity Tolerance Patient tolerated treatment well            Past Medical History:  Diagnosis Date   BPH (benign prostatic hyperplasia)    Broken ribs 07/09/2017   CAD (coronary artery disease) 07/09/2017   Collapsed lung 07/09/2017   DUE TO SLIPPING ON ICE IN [place] 2009   Coronary artery disease    History of kidney stones 07/09/2017   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   HTN (hypertension) 07/09/2017   Seasonal allergies 07/09/2017   Tinnitus 07/09/2017   Varicose vein of leg 07/09/2017   Past Surgical History:  Procedure Laterality Date   CORONARY ARTERY BYPASS GRAFT     HERNIA REPAIR Right 2005   [place] Left 2014   Patient Active Problem List   Diagnosis Date Noted   Monoclonal gammopathy 07/30/2021   Vitamin B12 deficiency 07/30/2021   Benign prostatic hyperplasia without lower urinary tract symptoms 05/20/2021   Allergic rhinitis 05/20/2021   Kidney stone 05/20/2021   Obstructive sleep apnea syndrome 05/20/2021   Pure hypercholesterolemia 05/20/2021   Sleep disorder 05/20/2021   HTN (hypertension) 07/09/2017   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   CAD (coronary artery disease) 07/09/2017   Varicose vein of leg 07/09/2017   History of kidney stones 07/09/2017   Broken ribs 07/09/2017   Collapsed lung 07/09/2017   Tinnitus 07/09/2017   Seasonal allergies 07/09/2017    ONSET DATE: referral date 02/18/2022, pt  reporting onset of speech change occurring Oct 2022  REFERRING DIAG:  F48.2 (ICD-10-CM) - Pseudobulbar affect  R47.81 (ICD-10-CM) - Slurred speech  R47.1 (ICD-10-CM) - Dysarthria    THERAPY DIAG:  Dysarthria  Rationale for Evaluation and Treatment Rehabilitation  SUBJECTIVE:   SUBJECTIVE STATEMENT: "It's not all the time"  Pt accompanied by: significant other, Melanie  PERTINENT HISTORY: per Dr. Berdine Addison PN 02/18/2022 "...slurred speech for about 1 year. It has slowly progressed since 1 year ago. He also does not have as good of balance as he used to have. Per patient and family, his symptoms fluctuate. He can have days that there is not slurred speech. There is no clear pattern or triggers. His imbalance does not fluctuate. He sometimes has difficulty getting words out when talking. He will know the word but cannot say it..."  PAIN:  Are you having pain? No   FALLS: Has patient fallen in last 6 months?  No  LIVING ENVIRONMENT: Lives with: lives with their spouse Lives in: House/apartment  PLOF:  Level of assistance: Independent with IADLs Employment: Retired   PATIENT GOALS "improve speech"  OBJECTIVE:   DIAGNOSTIC FINDINGS: 05/09/2021 MRI IMPRESSION: 1. No acute or subacute intracranial abnormality identified. 2. There are small chronic small vessel/lacunar type infarcts in the left corona radiata and lentiform, and right cerebellum.  COGNITION: Overall cognitive status: Within functional limits for tasks assessed Areas of impairment:  N/a Functional deficits: none evidenced or supported by pt or spouse  EXPRESSION: verbal   MOTOR SPEECH: Overall motor speech: impaired Level of impairment: Word Respiration: thoracic breathing Phonation: normal and low vocal intensity Resonance: WFL Articulation: Impaired: word Intelligibility: Intelligibility reduced Motor planning: Appears intact Motor speech errors:  n/a Interfering components: hearing loss and potential  neurologic etiology which is currently undiagnosed Effective technique: increased vocal intensity, over articulate, and pacing  OBJECTIVE VOICE ASSESSMENT: Sustained "ah" maximum phonation time: 11 seconds seconds Sustained "ah" loudness average: 72 dB Oral reading (passage) loudness average: 63 dB Oral reading loudness range: 61-64 dB Conversational loudness average: 63 dB Conversational loudness range: 61-64 dB Voice quality: low vocal intensity Stimulability trials: Given SLP modeling and occasional mod cues, loudness average increased to 68 dB (range of 61 to 72) during oral reading at paragraph level.  Comments: per pt "I feel like I'm shouting" when elevating vocal intensity, indicated altered perception of volume  ORAL MOTOR EXAMINATION Overall status: WFL Comments: overall unremarkable  RECOMMENDATIONS FROM OBJECTIVE SWALLOW STUDY (MBSS/FEES):  02/24/2022 Clinical Impression: Pt demonstrates no oral or oropharyngeal dysphagia. No penetration, aspiration or residual. Pts speech was slurred throughout assessment. Geographic tongue and slight left lingual deviation noted. Pt safe to continue a regular diet and thin liquids. Encouraged appt with SLP for dysarthria interventions.   PATIENT REPORTED OUTCOME MEASURES (PROM): Deferred d/t time constraints, to be completed initial therapy session  TODAY'S TREATMENT:  02/27/22: SLP provided education on observations made throughout evaluation process. Initiated training and education on strategies to aid in communication efficacy for the dysarthric speaker. Pt questions whether speech therapy will "fix" his speech, SLP educated on potential neurologic etiology which would be outside SLP scope of practice to fix, rather SLP role is to help pt be effective communicator through use of strategies. Pt and spouse verbalize understanding. Education on benefits of hearing aid use for overall brain health. Collaborated with pt and spouse to generate POC  and formulate therapeutic goals.   PATIENT EDUCATION: Education details: see above Person educated: Patient and Spouse Education method: Explanation, Demonstration, Verbal cues, and Handouts Education comprehension: verbalized understanding, returned demonstration, verbal cues required, and needs further education     GOALS: Goals reviewed with patient? Yes  SHORT TERM GOALS: Target date: 03/27/2022  Pt will report compliance with daily HEP practice (BID recommended) with mod-I over 1 week period Baseline: Goal status: INITIAL  2.  Pt will average 70+ dB during reading at paragraph level with occasional min-A over 2 sessions Baseline:  Goal status: INITIAL  3.  Pt will average 68+ dB during 7 minute structured conversation with occasional min-A over 2 sessions Baseline:  Goal status: INITIAL  4.  Pt will use dysarthria strategies of increased vocal intensity and over-articulation in noisy environment given rare min-A, in 5 minute conversation evidenced by no more than 2 requests for repetition by SLP Baseline:  Goal status: INITIAL   LONG TERM GOALS: Target date: 04/24/2022  Pt and spouse will report improved communication at home, with subjective report of less requests for repetition by wife over 1 week period Baseline:  Goal status: INITIAL  2.  Pt will self-ID volume decay in 80% of opportunities during structured speech exercise  Baseline:  Goal status: INITIAL  3.  Pt will average 70+ dB during moderately complex cognitive exercises with rare min-A over 2 sessions Baseline:  Goal status: INITIAL  4.  Pt will generalize increased vocal intensity to 20 minute conversational sample, resulting  in average of 68+ dB over sample course with rare min-A Baseline:  Goal status: INITIAL   ASSESSMENT:  CLINICAL IMPRESSION: Patient is a 76 y.o. M who was seen today for motor speech evaluation upon referral from Dr. Berdine Addison. Pt presents with mild dysarthria, characterized  by reduced vocal intensity (averages 63 dB, normal is 70 dB), monoloudness, and reduced amplitude of articulator movement resulting in imprecise articulation. [place] WFL, no dysfluencies evidenced, motor planning appears intact. Rate appears to be within normal limits for conversational speech. During structured exercises, rate increased. Voice is clear, without hoarseness, breathiness, or strained quality. Overall speech has "slurred" quality and dysarthria is resulting in reduced intelligibility of approximately 90% to trained listener in quiet environment. Denies swallowing impairment, supported by recent MBSS completed 02/24/2022. Oral mechanism exam unremarkable. Trial stimulibility for improved listener perception of speech though use of increased intensity and over-articulation with pt demonstrating ability to use both in reading exercise. Volume average increased to 68 dB with occasional min-A. SLP recommends ST to address mild dysarthria in effort to improve pt's communication efficacy.  OBJECTIVE IMPAIRMENTS include dysarthria. These impairments are limiting patient from effectively communicating at home and in community. Factors affecting potential to achieve goals and functional outcome are co-morbidities(?). Patient will benefit from skilled SLP services to address above impairments and improve overall function.  REHAB POTENTIAL: Good  PLAN: SLP FREQUENCY: 2x/week  SLP DURATION: 8 weeks  PLANNED INTERVENTIONS: Cueing hierachy, Internal/external aids, Functional tasks, SLP instruction and feedback, Compensatory strategies, and Patient/family education  ONIEL MELESKI, CCC-SLP 02/27/2022, 12:36 PM

## 2022-02-27 ENCOUNTER — Encounter: Payer: Self-pay | Admitting: Speech Pathology

## 2022-02-27 ENCOUNTER — Ambulatory Visit: Payer: Medicare Other | Attending: Neurology | Admitting: Speech Pathology

## 2022-02-27 DIAGNOSIS — R471 Dysarthria and anarthria: Secondary | ICD-10-CM | POA: Diagnosis not present

## 2022-02-27 NOTE — Patient Instructions (Signed)
SPEAK OUT! is a structured program. It is evidence based and based on principles of motor learning.   www.parkinsonvoiceproject.org for more information  Order your stimulus booklet: [phone] Your provider: Myra Gianotti SLP, [place]  This book is free!! They do offer a "pay if forward" option where you can donate to the non-profit organization so they can continue serving the Parkinson's population and providing these valuable resources to patients.

## 2022-03-10 ENCOUNTER — Ambulatory Visit: Payer: Medicare Other | Attending: Neurology | Admitting: Speech Pathology

## 2022-03-10 DIAGNOSIS — J9819 Other pulmonary collapse: Secondary | ICD-10-CM | POA: Insufficient documentation

## 2022-03-10 DIAGNOSIS — R1310 Dysphagia, unspecified: Secondary | ICD-10-CM | POA: Insufficient documentation

## 2022-03-10 DIAGNOSIS — R471 Dysarthria and anarthria: Secondary | ICD-10-CM | POA: Insufficient documentation

## 2022-03-10 NOTE — Patient Instructions (Addendum)
   When you start speaking closer to 70dB, you will feel like you are shouting - this is ok   Think about speaking with intent and giving effort for volume  When you speak with effort for volume, your speech naturally becomes less slurred and more precise  We don't treat the brain issue, but we treat the speech/language symptoms of the issue  In order to get louder you must take a good breath  Don't rush through the exercises - stop a and take a breath when it says stop - take a pause

## 2022-03-10 NOTE — Therapy (Signed)
OUTPATIENT SPEECH LANGUAGE PATHOLOGY TREATMENT NOTE   Patient Name: Michael Spencer MRN: [id number] DOB:Dec 15, 1945, 76 y.o., male   Today's Date: 03/10/2022  PCP: Wenda Low, MD  REFERRING PROVIDER:  Shellia Carwin, MD   END OF SESSION:   End of Session - 03/10/22 1212     Visit Number 2    Number of Visits 17    Date for SLP Re-Evaluation 04/24/22    Authorization Type Medicare    SLP Start Time 1015    SLP Stop Time  1100    SLP Time Calculation (min) 45 min    Activity Tolerance Patient tolerated treatment well             Past Medical History:  Diagnosis Date   BPH (benign prostatic hyperplasia)    Broken ribs 07/09/2017   CAD (coronary artery disease) 07/09/2017   Collapsed lung 07/09/2017   DUE TO SLIPPING ON ICE IN [place] 2009   Coronary artery disease    History of kidney stones 07/09/2017   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   HTN (hypertension) 07/09/2017   Seasonal allergies 07/09/2017   Tinnitus 07/09/2017   Varicose vein of leg 07/09/2017   Past Surgical History:  Procedure Laterality Date   CORONARY ARTERY BYPASS GRAFT     HERNIA REPAIR Right 2005   [place] Left 2014   Patient Active Problem List   Diagnosis Date Noted   Monoclonal gammopathy 07/30/2021   Vitamin B12 deficiency 07/30/2021   Benign prostatic hyperplasia without lower urinary tract symptoms 05/20/2021   Allergic rhinitis 05/20/2021   Kidney stone 05/20/2021   Obstructive sleep apnea syndrome 05/20/2021   Pure hypercholesterolemia 05/20/2021   Sleep disorder 05/20/2021   HTN (hypertension) 07/09/2017   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   CAD (coronary artery disease) 07/09/2017   Varicose vein of leg 07/09/2017   History of kidney stones 07/09/2017   Broken ribs 07/09/2017   Collapsed lung 07/09/2017   Tinnitus 07/09/2017   Seasonal allergies 07/09/2017    ONSET DATE:  referral date 02/18/2022, pt reporting onset of speech change occurring Oct 2022   REFERRING DIAG:  F48.2 (ICD-10-CM) - Pseudobulbar affect  R47.81 (ICD-10-CM) - Slurred speech  R47.1 (ICD-10-CM) - Dysarthria     THERAPY DIAG:  Dysarthria  Rationale for Evaluation and Treatment Rehabilitation  SUBJECTIVE: "They are going to send the book priority"  PAIN:  Are you having pain? No     OBJECTIVE:   TODAY'S TREATMENT:   03/10/22: Ongoing education that ST will address speech impairments and neurology will address etiology of speech impairment. Introduced [employer] Workbook and initiated training on HEP for dysarthria. Pt initially required frequent mod verbal cues and modeling to achieve target dBs for each exercise. Cues faded to usual mod visual and verbal cues to averaged 88dB on loud Ah, 85 on counting, 75 on reading and 72 on cognitive exercise. Tom benefited from ongoing reassurance that he is not shouting, but speaking at WNL volume when he achieves 70dB. He also benefited from my observations that his speech becomes more precise and less slurred when he speaks with intent and volume. In structured task generating 3 sentence descriptions, Tom averaged 68-70dB with usual min visual and verbal cues. In simple conversation re: his vacations, Tom averaged 70dB over 5 minutes, verbalizing awareness that he was giving effort to speak with intent and volume.    02/27/22: SLP  provided education on observations made throughout evaluation process. Initiated training and education on strategies to aid in communication efficacy for the dysarthric speaker. Pt questions whether speech therapy will "fix" his speech, SLP educated on potential neurologic etiology which would be outside SLP scope of practice to fix, rather SLP role is to help pt be effective communicator through use of strategies. Pt and spouse verbalize understanding. Education on benefits of hearing aid use for overall brain health.  Collaborated with pt and spouse to generate POC and formulate therapeutic goals.    PATIENT EDUCATION: Education details: see above, see patient education Person educated: Patient and Spouse Education method: Explanation, Demonstration, Verbal cues, and Handouts Education comprehension: verbalized understanding, returned demonstration, verbal cues required, and needs further education         GOALS: Goals reviewed with patient? Yes   SHORT TERM GOALS: Target date: 03/27/2022   Pt will report compliance with daily HEP practice (BID recommended) with mod-I over 1 week period Baseline: Goal status: INITIAL   2.  Pt will average 70+ dB during reading at paragraph level with occasional min-A over 2 sessions Baseline:  Goal status: INITIAL   3.  Pt will average 68+ dB during 7 minute structured conversation with occasional min-A over 2 sessions Baseline:  Goal status: INITIAL   4.  Pt will use dysarthria strategies of increased vocal intensity and over-articulation in noisy environment given rare min-A, in 5 minute conversation evidenced by no more than 2 requests for repetition by SLP Baseline:  Goal status: INITIAL     LONG TERM GOALS: Target date: 04/24/2022   Pt and spouse will report improved communication at home, with subjective report of less requests for repetition by wife over 1 week period Baseline:  Goal status: INITIAL   2.  Pt will self-ID volume decay in 80% of opportunities during structured speech exercise  Baseline:  Goal status: INITIAL   3.  Pt will average 70+ dB during moderately complex cognitive exercises with rare min-A over 2 sessions Baseline:  Goal status: INITIAL   4.  Pt will generalize increased vocal intensity to 20 minute conversational sample, resulting in average of 68+ dB over sample course with rare min-A Baseline:  Goal status: INITIAL     ASSESSMENT:   CLINICAL IMPRESSION: Patient is a 76 y.o. M who was seen today for motor  speech evaluation upon referral from Dr. Berdine Addison. Pt presents with mild dysarthria, characterized by reduced vocal intensity (averages 63 dB, normal is 70 dB), monoloudness, and reduced amplitude of articulator movement resulting in imprecise articulation. Prosody WFL, no dysfluencies evidenced, motor planning appears intact. Rate appears to be within normal limits for conversational speech. During structured exercises, rate increased. Voice is clear, without hoarseness, breathiness, or strained quality. Overall speech has "slurred" quality and dysarthria is resulting in reduced intelligibility of approximately 90% to trained listener in quiet environment. Denies swallowing impairment, supported by recent MBSS completed 02/24/2022. Oral mechanism exam unremarkable. Trial stimulibility for improved listener perception of speech though use of increased intensity and over-articulation with pt demonstrating ability to use both in reading exercise. Volume average increased to 68 dB with occasional min-A. SLP recommends ST to address mild dysarthria in effort to improve pt's communication efficacy.   OBJECTIVE IMPAIRMENTS include dysarthria. These impairments are limiting patient from effectively communicating at home and in community. Factors affecting potential to achieve goals and functional outcome are co-morbidities(?). Patient will benefit from skilled SLP services to address above impairments and improve overall  function.   REHAB POTENTIAL: Good   PLAN: SLP FREQUENCY: 2x/week   SLP DURATION: 8 weeks   PLANNED INTERVENTIONS: Cueing hierachy, Internal/external aids, Functional tasks, SLP instruction and feedback, Compensatory strategies, and Patient/family education       Alice Rieger Annye Rusk, CCC-SLP 03/10/2022, 12:19 PM

## 2022-03-13 ENCOUNTER — Ambulatory Visit: Payer: Medicare Other | Admitting: Speech Pathology

## 2022-03-13 DIAGNOSIS — J9819 Other pulmonary collapse: Secondary | ICD-10-CM | POA: Diagnosis not present

## 2022-03-13 DIAGNOSIS — R471 Dysarthria and anarthria: Secondary | ICD-10-CM

## 2022-03-13 DIAGNOSIS — R1310 Dysphagia, unspecified: Secondary | ICD-10-CM | POA: Diagnosis not present

## 2022-03-13 NOTE — Therapy (Signed)
OUTPATIENT SPEECH LANGUAGE PATHOLOGY TREATMENT NOTE   Patient Name: Michael Spencer MRN: [id number] DOB:1945-07-19, 76 y.o., male   Today's Date: 03/13/2022  PCP: Wenda Low, MD  REFERRING PROVIDER:  Shellia Carwin, MD   END OF SESSION:   End of Session - 03/13/22 1353     Visit Number 3    Number of Visits 17    Date for SLP Re-Evaluation 04/24/22    Authorization Type Medicare    SLP Start Time 1400    SLP Stop Time  8938    SLP Time Calculation (min) 45 min    Activity Tolerance Patient tolerated treatment well              Past Medical History:  Diagnosis Date   BPH (benign prostatic hyperplasia)    Broken ribs 07/09/2017   CAD (coronary artery disease) 07/09/2017   Collapsed lung 07/09/2017   DUE TO SLIPPING ON ICE IN [place] 2009   Coronary artery disease    History of kidney stones 07/09/2017   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   HTN (hypertension) 07/09/2017   Seasonal allergies 07/09/2017   Tinnitus 07/09/2017   Varicose vein of leg 07/09/2017   Past Surgical History:  Procedure Laterality Date   CORONARY ARTERY BYPASS GRAFT     HERNIA REPAIR Right 2005   [place] Left 2014   Patient Active Problem List   Diagnosis Date Noted   Monoclonal gammopathy 07/30/2021   Vitamin B12 deficiency 07/30/2021   Benign prostatic hyperplasia without lower urinary tract symptoms 05/20/2021   Allergic rhinitis 05/20/2021   Kidney stone 05/20/2021   Obstructive sleep apnea syndrome 05/20/2021   Pure hypercholesterolemia 05/20/2021   Sleep disorder 05/20/2021   HTN (hypertension) 07/09/2017   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   CAD (coronary artery disease) 07/09/2017   Varicose vein of leg 07/09/2017   History of kidney stones 07/09/2017   Broken ribs 07/09/2017   Collapsed lung 07/09/2017   Tinnitus 07/09/2017   Seasonal allergies 07/09/2017    ONSET  DATE: referral date 02/18/2022, pt reporting onset of speech change occurring Oct 2022   REFERRING DIAG:  F48.2 (ICD-10-CM) - Pseudobulbar affect  R47.81 (ICD-10-CM) - Slurred speech  R47.1 (ICD-10-CM) - Dysarthria     THERAPY DIAG:  Dysarthria  Rationale for Evaluation and Treatment Rehabilitation  SUBJECTIVE: "I haven't gotten my book yet"   PAIN:  Are you having pain? No     OBJECTIVE:   TODAY'S TREATMENT: 03-13-22: Pt reports to completing HEP, despite not having received Speak Out workbook yet. Target improving speech clarity and increasing intensity through progressively difficulty speech tasks using Speak Out! program, lesson 2. ST leads pt through exercises providing usual model prior to pt execution. usual mod-A required to achieve target dB this date. Averages this date: loud "ah" 80 dB; reading 74 dB; cognitive speech task 69 dB. Conversational sample of approx 5 minutes, pt averages 69 dB with usual max-A to carryover clarity of speech sounds. SLP provides feedback for slowed rate and over-articulation to increase efficacy of intentional speech to aid in overall improved naturalness and intelligibility.    03/10/22: Ongoing education that ST will address speech impairments and neurology will address etiology of speech impairment. Introduced [employer] Workbook and initiated training on HEP for dysarthria. Pt initially required frequent mod verbal cues and modeling to achieve target dBs for each exercise. Cues faded to usual  mod visual and verbal cues to averaged 88dB on loud Ah, 85 on counting, 75 on reading and 72 on cognitive exercise. Michael Spencer benefited from ongoing reassurance that he is not shouting, but speaking at WNL volume when he achieves 70dB. He also benefited from my observations that his speech becomes more precise and less slurred when he speaks with intent and volume. In structured task generating 3 sentence descriptions, Michael Spencer averaged 68-70dB with usual min visual and  verbal cues. In simple conversation re: his vacations, Michael Spencer averaged 70dB over 5 minutes, verbalizing awareness that he was giving effort to speak with intent and volume.    02/27/22: SLP provided education on observations made throughout evaluation process. Initiated training and education on strategies to aid in communication efficacy for the dysarthric speaker. Pt questions whether speech therapy will "fix" his speech, SLP educated on potential neurologic etiology which would be outside SLP scope of practice to fix, rather SLP role is to help pt be effective communicator through use of strategies. Pt and spouse verbalize understanding. Education on benefits of hearing aid use for overall brain health. Collaborated with pt and spouse to generate POC and formulate therapeutic goals.    PATIENT EDUCATION: Education details: see above, see patient education Person educated: Patient and Spouse Education method: Explanation, Demonstration, Verbal cues, and Handouts Education comprehension: verbalized understanding, returned demonstration, verbal cues required, and needs further education         GOALS: Goals reviewed with patient? Yes   SHORT TERM GOALS: Target date: 03/27/2022   Pt will report compliance with daily HEP practice (BID recommended) with mod-I over 1 week period Baseline: Goal status: INITIAL   2.  Pt will average 70+ dB during reading at paragraph level with occasional min-A over 2 sessions Baseline:  Goal status: INITIAL   3.  Pt will average 68+ dB during 7 minute structured conversation with occasional min-A over 2 sessions Baseline:  Goal status: INITIAL   4.  Pt will use dysarthria strategies of increased vocal intensity and over-articulation in noisy environment given rare min-A, in 5 minute conversation evidenced by no more than 2 requests for repetition by SLP Baseline:  Goal status: INITIAL     LONG TERM GOALS: Target date: 04/24/2022   Pt and spouse will  report improved communication at home, with subjective report of less requests for repetition by wife over 1 week period Baseline:  Goal status: INITIAL   2.  Pt will self-ID volume decay in 80% of opportunities during structured speech exercise  Baseline:  Goal status: INITIAL   3.  Pt will average 70+ dB during moderately complex cognitive exercises with rare min-A over 2 sessions Baseline:  Goal status: INITIAL   4.  Pt will generalize increased vocal intensity to 20 minute conversational sample, resulting in average of 68+ dB over sample course with rare min-A Baseline:  Goal status: INITIAL     ASSESSMENT:   CLINICAL IMPRESSION: Patient is a 76 y.o. M who was seen today for motor speech evaluation upon referral from Dr. Berdine Addison. Pt presents with mild dysarthria, characterized by reduced vocal intensity (averages 63 dB, normal is 70 dB), monoloudness, and reduced amplitude of articulator movement resulting in imprecise articulation. Prosody WFL, no dysfluencies evidenced, motor planning appears intact. Rate appears to be within normal limits for conversational speech. During structured exercises, rate increased. Voice is clear, without hoarseness, breathiness, or strained quality. Overall speech has "slurred" quality and dysarthria is resulting in reduced intelligibility of approximately 90% to  trained listener in quiet environment. Denies swallowing impairment, supported by recent MBSS completed 02/24/2022. Oral mechanism exam unremarkable. Trial stimulibility for improved listener perception of speech though use of increased intensity and over-articulation with pt demonstrating ability to use both in reading exercise. Volume average increased to 68 dB with occasional min-A. SLP recommends ST to address mild dysarthria in effort to improve pt's communication efficacy.   OBJECTIVE IMPAIRMENTS include dysarthria. These impairments are limiting patient from effectively communicating at home and  in community. Factors affecting potential to achieve goals and functional outcome are co-morbidities(?). Patient will benefit from skilled SLP services to address above impairments and improve overall function.   REHAB POTENTIAL: Good   PLAN: SLP FREQUENCY: 2x/week   SLP DURATION: 8 weeks   PLANNED INTERVENTIONS: Cueing hierachy, Internal/external aids, Functional tasks, SLP instruction and feedback, Compensatory strategies, and Patient/family education       PAULANTHONY GLEAVES, CCC-SLP 03/13/2022, 1:54 PM

## 2022-03-16 ENCOUNTER — Telehealth: Payer: Self-pay | Admitting: Neurology

## 2022-03-16 NOTE — Telephone Encounter (Signed)
Patient called and said his Nuedexta is around $[phone]. He'd like to know if there are any lower cost alternatives?  [facility] on [facility]

## 2022-03-17 ENCOUNTER — Ambulatory Visit: Payer: Medicare Other

## 2022-03-17 DIAGNOSIS — R1310 Dysphagia, unspecified: Secondary | ICD-10-CM | POA: Diagnosis not present

## 2022-03-17 DIAGNOSIS — R471 Dysarthria and anarthria: Secondary | ICD-10-CM

## 2022-03-17 DIAGNOSIS — J9819 Other pulmonary collapse: Secondary | ICD-10-CM | POA: Diagnosis not present

## 2022-03-17 NOTE — Therapy (Signed)
OUTPATIENT SPEECH LANGUAGE PATHOLOGY TREATMENT NOTE   Patient Name: Michael Spencer MRN: [id number] DOB:09/21/1945, 76 y.o., male   Today's Date: 03/17/2022  PCP: Wenda Low, MD  REFERRING PROVIDER:  Shellia Carwin, MD   END OF SESSION:   End of Session - 03/17/22 1802     Visit Number 4    Number of Visits 17    Date for SLP Re-Evaluation 04/24/22    SLP Start Time 31    SLP Stop Time  43    SLP Time Calculation (min) 42 min    Activity Tolerance Patient tolerated treatment well               Past Medical History:  Diagnosis Date   BPH (benign prostatic hyperplasia)    Broken ribs 07/09/2017   CAD (coronary artery disease) 07/09/2017   Collapsed lung 07/09/2017   DUE TO SLIPPING ON ICE IN [place] 2009   Coronary artery disease    History of kidney stones 07/09/2017   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   HTN (hypertension) 07/09/2017   Seasonal allergies 07/09/2017   Tinnitus 07/09/2017   Varicose vein of leg 07/09/2017   Past Surgical History:  Procedure Laterality Date   CORONARY ARTERY BYPASS GRAFT     HERNIA REPAIR Right 2005   [place] Left 2014   Patient Active Problem List   Diagnosis Date Noted   Monoclonal gammopathy 07/30/2021   Vitamin B12 deficiency 07/30/2021   Benign prostatic hyperplasia without lower urinary tract symptoms 05/20/2021   Allergic rhinitis 05/20/2021   Kidney stone 05/20/2021   Obstructive sleep apnea syndrome 05/20/2021   Pure hypercholesterolemia 05/20/2021   Sleep disorder 05/20/2021   HTN (hypertension) 07/09/2017   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   CAD (coronary artery disease) 07/09/2017   Varicose vein of leg 07/09/2017   History of kidney stones 07/09/2017   Broken ribs 07/09/2017   Collapsed lung 07/09/2017   Tinnitus 07/09/2017   Seasonal allergies 07/09/2017    ONSET DATE: referral date 02/18/2022, pt  reporting onset of speech change occurring Oct 2022   REFERRING DIAG:  F48.2 (ICD-10-CM) - Pseudobulbar affect  R47.81 (ICD-10-CM) - Slurred speech  R47.1 (ICD-10-CM) - Dysarthria     THERAPY DIAG:  Dysarthria  Rationale for Evaluation and Treatment Rehabilitation  SUBJECTIVE: " speech is better today."  PAIN:  Are you having pain? No     OBJECTIVE:   TODAY'S TREATMENT: 03-17-22: Pt supports slurred speech comes and goes with no correlation to internal/external factors. Target improving speech clarity and increasing intensity through progressively difficulty speech tasks using Speak Out! program, lesson 2. ST leads pt through exercises providing usual model prior to pt execution. Averages this date: loud "ah" 87 dB; reading 77 dB; cognitive speech task 69 dB. Pt demonstrated strong vocal intensity during structured task with supervision A verbal cues and in conversation min A verbal cues for carryover clarity of speech sounds. Pt supports speech quality is better today compared to last session. Pt read the "[place] grandfather passage" with a 19 db. SLP provided education pertaining to speaking with intent when disfluencies occur and education on continued use of Speak out!  03-13-22: Pt reports to completing HEP, despite not having received Speak Out workbook yet. Target improving speech clarity and increasing intensity through progressively difficulty speech tasks using Speak Out! program, lesson 2. ST leads pt through exercises providing usual model prior  to pt execution. usual mod-A required to achieve target dB this date. Averages this date: loud "ah" 80 dB; reading 74 dB; cognitive speech task 69 dB. Conversational sample of approx 5 minutes, pt averages 69 dB with usual max-A to carryover clarity of speech sounds. SLP provides feedback for slowed rate and over-articulation to increase efficacy of intentional speech to aid in overall improved naturalness and intelligibility.     03/10/22: Ongoing education that ST will address speech impairments and neurology will address etiology of speech impairment. Introduced [employer] Workbook and initiated training on HEP for dysarthria. Pt initially required frequent mod verbal cues and modeling to achieve target dBs for each exercise. Cues faded to usual mod visual and verbal cues to averaged 88dB on loud Ah, 85 on counting, 75 on reading and 72 on cognitive exercise. Tom benefited from ongoing reassurance that he is not shouting, but speaking at WNL volume when he achieves 70dB. He also benefited from my observations that his speech becomes more precise and less slurred when he speaks with intent and volume. In structured task generating 3 sentence descriptions, Tom averaged 68-70dB with usual min visual and verbal cues. In simple conversation re: his vacations, Tom averaged 70dB over 5 minutes, verbalizing awareness that he was giving effort to speak with intent and volume.    PATIENT EDUCATION: Education details: see above, see patient education Person educated: Patient and Spouse Education method: Explanation, Demonstration, Verbal cues, and Handouts Education comprehension: verbalized understanding, returned demonstration, verbal cues required, and needs further education         GOALS: Goals reviewed with patient? Yes   SHORT TERM GOALS: Target date: 03/27/2022   Pt will report compliance with daily HEP practice (BID recommended) with mod-I over 1 week period Baseline: Goal status: INITIAL   2.  Pt will average 70+ dB during reading at paragraph level with occasional min-A over 2 sessions Baseline:  Goal status: INITIAL   3.  Pt will average 68+ dB during 7 minute structured conversation with occasional min-A over 2 sessions Baseline:  Goal status: INITIAL   4.  Pt will use dysarthria strategies of increased vocal intensity and over-articulation in noisy environment given rare min-A, in 5 minute conversation  evidenced by no more than 2 requests for repetition by SLP Baseline:  Goal status: INITIAL     LONG TERM GOALS: Target date: 04/24/2022   Pt and spouse will report improved communication at home, with subjective report of less requests for repetition by wife over 1 week period Baseline:  Goal status: INITIAL   2.  Pt will self-ID volume decay in 80% of opportunities during structured speech exercise  Baseline:  Goal status: INITIAL   3.  Pt will average 70+ dB during moderately complex cognitive exercises with rare min-A over 2 sessions Baseline:  Goal status: INITIAL   4.  Pt will generalize increased vocal intensity to 20 minute conversational sample, resulting in average of 68+ dB over sample course with rare min-A Baseline:  Goal status: INITIAL     ASSESSMENT:   CLINICAL IMPRESSION: Patient is a 76 y.o. M who was seen today for motor speech evaluation upon referral from Dr. Berdine Addison. Pt presents with mild dysarthria, characterized by reduced vocal intensity (averages 63 dB, normal is 70 dB), monoloudness, and reduced amplitude of articulator movement resulting in imprecise articulation. [place] WFL, no dysfluencies evidenced, motor planning appears intact. Rate appears to be within normal limits for conversational speech. During structured exercises, rate increased. Voice  is clear, without hoarseness, breathiness, or strained quality. Overall speech has "slurred" quality and dysarthria is resulting in reduced intelligibility of approximately 90% to trained listener in quiet environment. Denies swallowing impairment, supported by recent MBSS completed 02/24/2022. Oral mechanism exam unremarkable. Trial stimulibility for improved listener perception of speech though use of increased intensity and over-articulation with pt demonstrating ability to use both in reading exercise. Volume average increased to 68 dB with occasional min-A. SLP recommends ST to address mild dysarthria in effort to  improve pt's communication efficacy.   OBJECTIVE IMPAIRMENTS include dysarthria. These impairments are limiting patient from effectively communicating at home and in community. Factors affecting potential to achieve goals and functional outcome are co-morbidities(?). Patient will benefit from skilled SLP services to address above impairments and improve overall function.   REHAB POTENTIAL: Good   PLAN: SLP FREQUENCY: 2x/week   SLP DURATION: 8 weeks   PLANNED INTERVENTIONS: Cueing hierachy, Internal/external aids, Functional tasks, SLP instruction and feedback, Compensatory strategies, and Patient/family education       [place], [place] 03/17/2022, 6:05 PM

## 2022-03-17 NOTE — Telephone Encounter (Signed)
Pt is returning a call to heather 

## 2022-03-17 NOTE — Telephone Encounter (Signed)
Pt called no answer left a voice mail to call back  

## 2022-03-17 NOTE — Telephone Encounter (Signed)
Pt called he will bring the paperwork back when he is done filling it out,

## 2022-03-17 NOTE — Telephone Encounter (Signed)
Pt stated that the Nuedexta helped him, I have sent off to see if a PA is needed. I also placed up front nuedexta assistance form to see if he can get help, a good RX card, an instruction on how to get a nuedexta card if he qualifies,

## 2022-03-20 ENCOUNTER — Ambulatory Visit: Payer: Medicare Other | Admitting: Speech Pathology

## 2022-03-20 DIAGNOSIS — R471 Dysarthria and anarthria: Secondary | ICD-10-CM | POA: Diagnosis not present

## 2022-03-20 DIAGNOSIS — R1310 Dysphagia, unspecified: Secondary | ICD-10-CM | POA: Diagnosis not present

## 2022-03-20 DIAGNOSIS — J9819 Other pulmonary collapse: Secondary | ICD-10-CM | POA: Diagnosis not present

## 2022-03-20 NOTE — Therapy (Signed)
OUTPATIENT SPEECH LANGUAGE PATHOLOGY TREATMENT NOTE   Patient Name: Michael Spencer MRN: [id number] DOB:[birth year], 76 y.o., male   Today's Date: 03/20/2022  PCP: Wenda Low, MD  REFERRING PROVIDER:  Shellia Carwin, MD   END OF SESSION:   End of Session - 03/20/22 1445     Visit Number 5    Number of Visits 17    Date for SLP Re-Evaluation 04/24/22    Authorization Type Medicare    Progress Note Due on Visit 10    SLP Start Time 1445    SLP Stop Time  0258    SLP Time Calculation (min) 45 min    Activity Tolerance Patient tolerated treatment well               Past Medical History:  Diagnosis Date   BPH (benign prostatic hyperplasia)    Broken ribs 07/09/2017   CAD (coronary artery disease) 07/09/2017   Collapsed lung 07/09/2017   DUE TO SLIPPING ON ICE IN [place] 2009   Coronary artery disease    History of kidney stones 07/09/2017   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   HTN (hypertension) 07/09/2017   Seasonal allergies 07/09/2017   Tinnitus 07/09/2017   Varicose vein of leg 07/09/2017   Past Surgical History:  Procedure Laterality Date   CORONARY ARTERY BYPASS GRAFT     HERNIA REPAIR Right 2005   [place] Left 2014   Patient Active Problem List   Diagnosis Date Noted   Monoclonal gammopathy 07/30/2021   Vitamin B12 deficiency 07/30/2021   Benign prostatic hyperplasia without lower urinary tract symptoms 05/20/2021   Allergic rhinitis 05/20/2021   Kidney stone 05/20/2021   Obstructive sleep apnea syndrome 05/20/2021   Pure hypercholesterolemia 05/20/2021   Sleep disorder 05/20/2021   HTN (hypertension) 07/09/2017   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   CAD (coronary artery disease) 07/09/2017   Varicose vein of leg 07/09/2017   History of kidney stones 07/09/2017   Broken ribs 07/09/2017   Collapsed lung 07/09/2017   Tinnitus 07/09/2017   Seasonal  allergies 07/09/2017    ONSET DATE: referral date 02/18/2022, pt reporting onset of speech change occurring Oct 2022   REFERRING DIAG:  F48.2 (ICD-10-CM) - Pseudobulbar affect  R47.81 (ICD-10-CM) - Slurred speech  R47.1 (ICD-10-CM) - Dysarthria     THERAPY DIAG:  Dysarthria  Dysphagia, unspecified type  Rationale for Evaluation and Treatment Rehabilitation  SUBJECTIVE: denies changes, received Speak Out workbook   PAIN:  Are you having pain? No     OBJECTIVE:   TODAY'S TREATMENT: 03-20-22: Target improving vocal quality and increasing intensity through progressively difficulty speech tasks using Speak Out! program, lesson 8. ST leads pt through exercises providing occasional model prior to pt execution. occasional min-A required to achieve target dB this date. Averages this date: loud "ah" 87 dB; reading 74 dB; cognitive speech task 74 dB. SLP had pt facing mirror to provide biofeedback re: overarticulation with pt demonstrating increasing awareness of decreased articulator movement impacting clarity of speech. Carryover of overarticulation demonstrated during 10/15 generative sentences with rare min-A, x3 instances of self correction. Conversational speech following structured practice requiring usual mod-A for carryover of increased vocal intensity and amplitude of articulation.    03-17-22: Pt supports slurred speech comes and goes with no correlation to internal/external factors. Target improving speech clarity and increasing intensity through progressively difficulty speech tasks using Speak Out! program, lesson  2. ST leads pt through exercises providing usual model prior to pt execution. Averages this date: loud "ah" 87 dB; reading 77 dB; cognitive speech task 69 dB. Pt demonstrated strong vocal intensity during structured task with supervision A verbal cues and in conversation min A verbal cues for carryover clarity of speech sounds. Pt supports speech quality is better today  compared to last session. Pt read the "The grandfather passage" with a 11 db. SLP provided education pertaining to speaking with intent when disfluencies occur and education on continued use of Speak out!  03-13-22: Pt reports to completing HEP, despite not having received Speak Out workbook yet. Target improving speech clarity and increasing intensity through progressively difficulty speech tasks using Speak Out! program, lesson 2. ST leads pt through exercises providing usual model prior to pt execution. usual mod-A required to achieve target dB this date. Averages this date: loud "ah" 80 dB; reading 74 dB; cognitive speech task 69 dB. Conversational sample of approx 5 minutes, pt averages 69 dB with usual max-A to carryover clarity of speech sounds. SLP provides feedback for slowed rate and over-articulation to increase efficacy of intentional speech to aid in overall improved naturalness and intelligibility.    03/10/22: Ongoing education that ST will address speech impairments and neurology will address etiology of speech impairment. Introduced [employer] Workbook and initiated training on HEP for dysarthria. Pt initially required frequent mod verbal cues and modeling to achieve target dBs for each exercise. Cues faded to usual mod visual and verbal cues to averaged 88dB on loud Ah, 85 on counting, 75 on reading and 72 on cognitive exercise. Tom benefited from ongoing reassurance that he is not shouting, but speaking at WNL volume when he achieves 70dB. He also benefited from my observations that his speech becomes more precise and less slurred when he speaks with intent and volume. In structured task generating 3 sentence descriptions, Tom averaged 68-70dB with usual min visual and verbal cues. In simple conversation re: his vacations, Tom averaged 70dB over 5 minutes, verbalizing awareness that he was giving effort to speak with intent and volume.    PATIENT EDUCATION: Education details: see above,  see patient education Person educated: Patient and Spouse Education method: Explanation, Demonstration, Verbal cues, and Handouts Education comprehension: verbalized understanding, returned demonstration, verbal cues required, and needs further education         GOALS: Goals reviewed with patient? Yes   SHORT TERM GOALS: Target date: 03/27/2022   Pt will report compliance with daily HEP practice (BID recommended) with mod-I over 1 week period Baseline: Goal status: INITIAL   2.  Pt will average 70+ dB during reading at paragraph level with occasional min-A over 2 sessions Baseline:  Goal status: INITIAL   3.  Pt will average 68+ dB during 7 minute structured conversation with occasional min-A over 2 sessions Baseline:  Goal status: INITIAL   4.  Pt will use dysarthria strategies of increased vocal intensity and over-articulation in noisy environment given rare min-A, in 5 minute conversation evidenced by no more than 2 requests for repetition by SLP Baseline:  Goal status: INITIAL     LONG TERM GOALS: Target date: 04/24/2022   Pt and spouse will report improved communication at home, with subjective report of less requests for repetition by wife over 1 week period Baseline:  Goal status: INITIAL   2.  Pt will self-ID volume decay in 80% of opportunities during structured speech exercise  Baseline:  Goal status: INITIAL  3.  Pt will average 70+ dB during moderately complex cognitive exercises with rare min-A over 2 sessions Baseline:  Goal status: INITIAL   4.  Pt will generalize increased vocal intensity to 20 minute conversational sample, resulting in average of 68+ dB over sample course with rare min-A Baseline:  Goal status: INITIAL     ASSESSMENT:   CLINICAL IMPRESSION: Patient is a 76 y.o. M who was seen today for motor speech evaluation upon referral from Dr. Berdine Addison. Pt presents with mild dysarthria, characterized by reduced vocal intensity (averages 63 dB,  normal is 70 dB), monoloudness, and reduced amplitude of articulator movement resulting in imprecise articulation. [place] WFL, no dysfluencies evidenced, motor planning appears intact. Rate appears to be within normal limits for conversational speech. During structured exercises, rate increased. Voice is clear, without hoarseness, breathiness, or strained quality. Overall speech has "slurred" quality and dysarthria is resulting in reduced intelligibility of approximately 90% to trained listener in quiet environment. Denies swallowing impairment, supported by recent MBSS completed 02/24/2022. Oral mechanism exam unremarkable. Trial stimulibility for improved listener perception of speech though use of increased intensity and over-articulation with pt demonstrating ability to use both in reading exercise. Volume average increased to 68 dB with occasional min-A. SLP recommends ST to address mild dysarthria in effort to improve pt's communication efficacy.   OBJECTIVE IMPAIRMENTS include dysarthria. These impairments are limiting patient from effectively communicating at home and in community. Factors affecting potential to achieve goals and functional outcome are co-morbidities(?). Patient will benefit from skilled SLP services to address above impairments and improve overall function.   REHAB POTENTIAL: Good   PLAN: SLP FREQUENCY: 2x/week   SLP DURATION: 8 weeks   PLANNED INTERVENTIONS: Cueing hierachy, Internal/external aids, Functional tasks, SLP instruction and feedback, Compensatory strategies, and Patient/family education       OSRIC KLOPF, CCC-SLP 03/20/2022, 2:45 PM

## 2022-03-24 ENCOUNTER — Ambulatory Visit: Payer: Medicare Other | Admitting: Speech Pathology

## 2022-03-24 DIAGNOSIS — R1310 Dysphagia, unspecified: Secondary | ICD-10-CM | POA: Diagnosis not present

## 2022-03-24 DIAGNOSIS — J9819 Other pulmonary collapse: Secondary | ICD-10-CM | POA: Diagnosis not present

## 2022-03-24 DIAGNOSIS — R471 Dysarthria and anarthria: Secondary | ICD-10-CM

## 2022-03-24 NOTE — Therapy (Signed)
OUTPATIENT SPEECH LANGUAGE PATHOLOGY TREATMENT NOTE   Patient Name: Michael Spencer MRN: [id number] DOB:[birth year], 76 y.o., male   Today's Date: 03/24/2022  PCP: Wenda Low, MD  REFERRING PROVIDER:  Shellia Carwin, MD   END OF SESSION:   End of Session - 03/24/22 1104     Visit Number 6    Number of Visits 17    Date for SLP Re-Evaluation 04/24/22    Authorization Type Medicare    Progress Note Due on Visit 10    SLP Start Time 1104    SLP Stop Time  1145    SLP Time Calculation (min) 41 min    Activity Tolerance Patient tolerated treatment well               Past Medical History:  Diagnosis Date   BPH (benign prostatic hyperplasia)    Broken ribs 07/09/2017   CAD (coronary artery disease) 07/09/2017   Collapsed lung 07/09/2017   DUE TO SLIPPING ON ICE IN [place] 2009   Coronary artery disease    History of kidney stones 07/09/2017   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   HTN (hypertension) 07/09/2017   Seasonal allergies 07/09/2017   Tinnitus 07/09/2017   Varicose vein of leg 07/09/2017   Past Surgical History:  Procedure Laterality Date   CORONARY ARTERY BYPASS GRAFT     HERNIA REPAIR Right 2005   [place] Left 2014   Patient Active Problem List   Diagnosis Date Noted   Monoclonal gammopathy 07/30/2021   Vitamin B12 deficiency 07/30/2021   Benign prostatic hyperplasia without lower urinary tract symptoms 05/20/2021   Allergic rhinitis 05/20/2021   Kidney stone 05/20/2021   Obstructive sleep apnea syndrome 05/20/2021   Pure hypercholesterolemia 05/20/2021   Sleep disorder 05/20/2021   HTN (hypertension) 07/09/2017   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   CAD (coronary artery disease) 07/09/2017   Varicose vein of leg 07/09/2017   History of kidney stones 07/09/2017   Broken ribs 07/09/2017   Collapsed lung 07/09/2017   Tinnitus 07/09/2017   Seasonal  allergies 07/09/2017    ONSET DATE: referral date 02/18/2022, pt reporting onset of speech change occurring Oct 2022   REFERRING DIAG:  F48.2 (ICD-10-CM) - Pseudobulbar affect  R47.81 (ICD-10-CM) - Slurred speech  R47.1 (ICD-10-CM) - Dysarthria     THERAPY DIAG:  Dysarthria  Dysphagia, unspecified type  Rationale for Evaluation and Treatment Rehabilitation  SUBJECTIVE: Pt reports ongoing, daily HEP practice.   PAIN:  Are you having pain? No     OBJECTIVE:   TODAY'S TREATMENT: 03-24-22: SLP provided education on hearing and brain health. Pt reports "probably needs" hearing aids but doesn't think they'd be useful. Target improving vocal quality and increasing intensity through progressively difficulty speech tasks using Speak Out! program, lesson 12. ST leads pt through exercises providing occasional model prior to pt execution. occasional mod-A required to achieve target dB this date. Averages this date: loud "ah" 92 dB; reading 76 dB; cognitive speech task 74 dB. Conversational sample of approx 5 minutes, pt averages 71 dB with occasional min-A.Focus on over-articulation as dysarthria strategy throughout structured practice with pt able to demonstrate carryover of strategy with 90% accuracy and occasional self-correction. Pt reporting using intent through over-articulation and increased volume feels "weird," SLP provides positive feedback for naturalness sounding and increased clarity of speech. Plan to work on overarticulation on blends and mult-syllabic words at next session  per pt request.    03-20-22: Target improving vocal quality and increasing intensity through progressively difficulty speech tasks using Speak Out! program, lesson 8. ST leads pt through exercises providing occasional model prior to pt execution. occasional min-A required to achieve target dB this date. Averages this date: loud "ah" 87 dB; reading 74 dB; cognitive speech task 74 dB. SLP had pt facing mirror to  provide biofeedback re: overarticulation with pt demonstrating increasing awareness of decreased articulator movement impacting clarity of speech. Carryover of overarticulation demonstrated during 10/15 generative sentences with rare min-A, x3 instances of self correction. Conversational speech following structured practice requiring usual mod-A for carryover of increased vocal intensity and amplitude of articulation.    03-17-22: Pt supports slurred speech comes and goes with no correlation to internal/external factors. Target improving speech clarity and increasing intensity through progressively difficulty speech tasks using Speak Out! program, lesson 2. ST leads pt through exercises providing usual model prior to pt execution. Averages this date: loud "ah" 87 dB; reading 77 dB; cognitive speech task 69 dB. Pt demonstrated strong vocal intensity during structured task with supervision A verbal cues and in conversation min A verbal cues for carryover clarity of speech sounds. Pt supports speech quality is better today compared to last session. Pt read the "The grandfather passage" with a 69 db. SLP provided education pertaining to speaking with intent when disfluencies occur and education on continued use of Speak out!  03-13-22: Pt reports to completing HEP, despite not having received Speak Out workbook yet. Target improving speech clarity and increasing intensity through progressively difficulty speech tasks using Speak Out! program, lesson 2. ST leads pt through exercises providing usual model prior to pt execution. usual mod-A required to achieve target dB this date. Averages this date: loud "ah" 80 dB; reading 74 dB; cognitive speech task 69 dB. Conversational sample of approx 5 minutes, pt averages 69 dB with usual max-A to carryover clarity of speech sounds. SLP provides feedback for slowed rate and over-articulation to increase efficacy of intentional speech to aid in overall improved naturalness and  intelligibility.    03/10/22: Ongoing education that ST will address speech impairments and neurology will address etiology of speech impairment. Introduced [employer] Workbook and initiated training on HEP for dysarthria. Pt initially required frequent mod verbal cues and modeling to achieve target dBs for each exercise. Cues faded to usual mod visual and verbal cues to averaged 88dB on loud Ah, 85 on counting, 75 on reading and 72 on cognitive exercise. Tom benefited from ongoing reassurance that he is not shouting, but speaking at WNL volume when he achieves 70dB. He also benefited from my observations that his speech becomes more precise and less slurred when he speaks with intent and volume. In structured task generating 3 sentence descriptions, Tom averaged 68-70dB with usual min visual and verbal cues. In simple conversation re: his vacations, Tom averaged 70dB over 5 minutes, verbalizing awareness that he was giving effort to speak with intent and volume.    PATIENT EDUCATION: Education details: see above, see patient education Person educated: Patient and Spouse Education method: Explanation, Demonstration, Verbal cues, and Handouts Education comprehension: verbalized understanding, returned demonstration, verbal cues required, and needs further education         GOALS: Goals reviewed with patient? Yes   SHORT TERM GOALS: Target date: 03/27/2022   Pt will report compliance with daily HEP practice (BID recommended) with mod-I over 1 week period Baseline: Goal status: INITIAL   2.  Pt will average 70+ dB during reading at paragraph level with occasional min-A over 2 sessions Baseline:  Goal status: INITIAL   3.  Pt will average 68+ dB during 7 minute structured conversation with occasional min-A over 2 sessions Baseline:  Goal status: INITIAL   4.  Pt will use dysarthria strategies of increased vocal intensity and over-articulation in noisy environment given rare min-A, in 5  minute conversation evidenced by no more than 2 requests for repetition by SLP Baseline:  Goal status: INITIAL     LONG TERM GOALS: Target date: 04/24/2022   Pt and spouse will report improved communication at home, with subjective report of less requests for repetition by wife over 1 week period Baseline:  Goal status: INITIAL   2.  Pt will self-ID volume decay in 80% of opportunities during structured speech exercise  Baseline:  Goal status: INITIAL   3.  Pt will average 70+ dB during moderately complex cognitive exercises with rare min-A over 2 sessions Baseline:  Goal status: INITIAL   4.  Pt will generalize increased vocal intensity to 20 minute conversational sample, resulting in average of 68+ dB over sample course with rare min-A Baseline:  Goal status: INITIAL     ASSESSMENT:   CLINICAL IMPRESSION: Patient is a 76 y.o. M who was seen today for motor speech evaluation upon referral from Dr. Berdine Addison. Pt presents with mild dysarthria, characterized by reduced vocal intensity (averages 63 dB, normal is 70 dB), monoloudness, and reduced amplitude of articulator movement resulting in imprecise articulation. [place] WFL, no dysfluencies evidenced, motor planning appears intact. Rate appears to be within normal limits for conversational speech. During structured exercises, rate increased. Voice is clear, without hoarseness, breathiness, or strained quality. Overall speech has "slurred" quality and dysarthria is resulting in reduced intelligibility of approximately 90% to trained listener in quiet environment. Denies swallowing impairment, supported by recent MBSS completed 02/24/2022. Oral mechanism exam unremarkable. Trial stimulibility for improved listener perception of speech though use of increased intensity and over-articulation with pt demonstrating ability to use both in reading exercise. Volume average increased to 68 dB with occasional min-A. SLP recommends ST to address mild  dysarthria in effort to improve pt's communication efficacy.   OBJECTIVE IMPAIRMENTS include dysarthria. These impairments are limiting patient from effectively communicating at home and in community. Factors affecting potential to achieve goals and functional outcome are co-morbidities(?). Patient will benefit from skilled SLP services to address above impairments and improve overall function.   REHAB POTENTIAL: Good   PLAN: SLP FREQUENCY: 2x/week   SLP DURATION: 8 weeks   PLANNED INTERVENTIONS: Cueing hierachy, Internal/external aids, Functional tasks, SLP instruction and feedback, Compensatory strategies, and Patient/family education       KANI CHAUVIN, CCC-SLP 03/24/2022, 11:05 AM

## 2022-03-25 ENCOUNTER — Telehealth: Payer: Self-pay | Admitting: Neurology

## 2022-03-25 ENCOUNTER — Other Ambulatory Visit (HOSPITAL_COMMUNITY): Payer: Self-pay

## 2022-03-25 NOTE — Telephone Encounter (Signed)
Received notification from [facility] regarding a prior authorization for  Nuedexta 20-'10MG'$  . Authorization has been APPROVED from 06/01/21 to 05/31/22.   Ran test claim, patient's copay with insurance is $355.37 for 13 capsules.  Authorization # Key: [id number]

## 2022-03-25 NOTE — Telephone Encounter (Signed)
Received notification from [facility] regarding a prior authorization for  Nuedexta 20-'10MG'$  . Authorization has been APPROVED from 06/01/21 to 05/31/22.    Ran test claim, patient's copay with insurance is $355.37 for 13 capsules.   Authorization # Key: [id number]

## 2022-03-25 NOTE — Telephone Encounter (Signed)
Patient states that he got word that the Nuedexta  was approved by [facility]. He has been working with Nira Conn about this and he needs to know if we got word as well and what happens now. It is about cost reduction

## 2022-03-25 NOTE — Telephone Encounter (Signed)
Submitted a Prior Authorization request to [facility] for  Nuedexta 20-'10MG'$   via CoverMyMeds. Will update once we receive a response.   [employer] Pu- [id number]

## 2022-03-26 NOTE — Telephone Encounter (Signed)
Called and left message to call back.

## 2022-03-26 NOTE — Telephone Encounter (Signed)
Pt came to office and picked up papers to fill out.

## 2022-03-26 NOTE — Telephone Encounter (Signed)
Patient called with questions about this process.

## 2022-03-27 ENCOUNTER — Ambulatory Visit: Payer: Medicare Other | Admitting: Speech Pathology

## 2022-03-27 DIAGNOSIS — R471 Dysarthria and anarthria: Secondary | ICD-10-CM

## 2022-03-27 DIAGNOSIS — R1310 Dysphagia, unspecified: Secondary | ICD-10-CM | POA: Diagnosis not present

## 2022-03-27 DIAGNOSIS — J9819 Other pulmonary collapse: Secondary | ICD-10-CM | POA: Diagnosis not present

## 2022-03-27 NOTE — Therapy (Addendum)
OUTPATIENT SPEECH LANGUAGE PATHOLOGY TREATMENT NOTE   Patient Name: Michael Spencer MRN: [id number] DOB:07-17-1945, 76 y.o., male   Today's Date: 03/27/2022  PCP: Wenda Low, MD  REFERRING PROVIDER:  Shellia Carwin, MD   END OF SESSION:   End of Session - 03/27/22 1400     Visit Number 7    Number of Visits 17    Date for SLP Re-Evaluation 04/24/22    Authorization Type Medicare    Progress Note Due on Visit 10    SLP Start Time 1400    SLP Stop Time  1435    SLP Time Calculation (min) 35 min    Activity Tolerance Patient tolerated treatment well                Past Medical History:  Diagnosis Date   BPH (benign prostatic hyperplasia)    Broken ribs 07/09/2017   CAD (coronary artery disease) 07/09/2017   Collapsed lung 07/09/2017   DUE TO SLIPPING ON ICE IN [place] 2009   Coronary artery disease    History of kidney stones 07/09/2017   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   HTN (hypertension) 07/09/2017   Seasonal allergies 07/09/2017   Tinnitus 07/09/2017   Varicose vein of leg 07/09/2017   Past Surgical History:  Procedure Laterality Date   CORONARY ARTERY BYPASS GRAFT     HERNIA REPAIR Right 2005   [place] Left 2014   Patient Active Problem List   Diagnosis Date Noted   Monoclonal gammopathy 07/30/2021   Vitamin B12 deficiency 07/30/2021   Benign prostatic hyperplasia without lower urinary tract symptoms 05/20/2021   Allergic rhinitis 05/20/2021   Kidney stone 05/20/2021   Obstructive sleep apnea syndrome 05/20/2021   Pure hypercholesterolemia 05/20/2021   Sleep disorder 05/20/2021   HTN (hypertension) 07/09/2017   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   CAD (coronary artery disease) 07/09/2017   Varicose vein of leg 07/09/2017   History of kidney stones 07/09/2017   Broken ribs 07/09/2017   Collapsed lung 07/09/2017   Tinnitus 07/09/2017   Seasonal  allergies 07/09/2017    ONSET DATE: referral date 02/18/2022, pt reporting onset of speech change occurring Oct 2022   REFERRING DIAG:  F48.2 (ICD-10-CM) - Pseudobulbar affect  R47.81 (ICD-10-CM) - Slurred speech  R47.1 (ICD-10-CM) - Dysarthria     THERAPY DIAG:  Dysarthria  Rationale for Evaluation and Treatment Rehabilitation  SUBJECTIVE: Pt reports 15 minute episode of slurred speech and anomia yesterday which was frustrating for pt  PAIN:  Are you having pain? No  OBJECTIVE:   TODAY'S TREATMENT: 03-27-22: SLP provided education for anomia compensations of intentional circumlocution, use of gestures to aid in communication coherence when word finding episodes occur. SLP facilitated practice of intentional circumlocution in task where pt read 3-4 syllable word and then provided description for word. Pt accurate in 95% of trials, min-A to determine solution for remaining trials. SLP encourages pt to attempt to employ dysarthria and anomia strategies and compensations when episodes occur when able. Pt verbalizes understanding.   03-24-22: SLP provided education on hearing and brain health. Pt reports "probably needs" hearing aids but doesn't think they'd be useful. Target improving vocal quality and increasing intensity through progressively difficulty speech tasks using Speak Out! program, lesson 12. ST leads pt through exercises providing occasional model prior to pt execution. occasional mod-A required to achieve target dB this date. Averages this date: loud "  ah" 92 dB; reading 76 dB; cognitive speech task 74 dB. Conversational sample of approx 5 minutes, pt averages 71 dB with occasional min-A.Focus on over-articulation as dysarthria strategy throughout structured practice with pt able to demonstrate carryover of strategy with 90% accuracy and occasional self-correction. Pt reporting using intent through over-articulation and increased volume feels "weird," SLP provides positive feedback  for naturalness sounding and increased clarity of speech. Plan to work on overarticulation on blends and mult-syllabic words at next session per pt request.    03-20-22: Target improving vocal quality and increasing intensity through progressively difficulty speech tasks using Speak Out! program, lesson 8. ST leads pt through exercises providing occasional model prior to pt execution. occasional min-A required to achieve target dB this date. Averages this date: loud "ah" 87 dB; reading 74 dB; cognitive speech task 74 dB. SLP had pt facing mirror to provide biofeedback re: overarticulation with pt demonstrating increasing awareness of decreased articulator movement impacting clarity of speech. Carryover of overarticulation demonstrated during 10/15 generative sentences with rare min-A, x3 instances of self correction. Conversational speech following structured practice requiring usual mod-A for carryover of increased vocal intensity and amplitude of articulation.    03-17-22: Pt supports slurred speech comes and goes with no correlation to internal/external factors. Target improving speech clarity and increasing intensity through progressively difficulty speech tasks using Speak Out! program, lesson 2. ST leads pt through exercises providing usual model prior to pt execution. Averages this date: loud "ah" 87 dB; reading 77 dB; cognitive speech task 69 dB. Pt demonstrated strong vocal intensity during structured task with supervision A verbal cues and in conversation min A verbal cues for carryover clarity of speech sounds. Pt supports speech quality is better today compared to last session. Pt read the "The grandfather passage" with a 60 db. SLP provided education pertaining to speaking with intent when disfluencies occur and education on continued use of Speak out!  03-13-22: Pt reports to completing HEP, despite not having received Speak Out workbook yet. Target improving speech clarity and increasing  intensity through progressively difficulty speech tasks using Speak Out! program, lesson 2. ST leads pt through exercises providing usual model prior to pt execution. usual mod-A required to achieve target dB this date. Averages this date: loud "ah" 80 dB; reading 74 dB; cognitive speech task 69 dB. Conversational sample of approx 5 minutes, pt averages 69 dB with usual max-A to carryover clarity of speech sounds. SLP provides feedback for slowed rate and over-articulation to increase efficacy of intentional speech to aid in overall improved naturalness and intelligibility.    03/10/22: Ongoing education that ST will address speech impairments and neurology will address etiology of speech impairment. Introduced [employer] Workbook and initiated training on HEP for dysarthria. Pt initially required frequent mod verbal cues and modeling to achieve target dBs for each exercise. Cues faded to usual mod visual and verbal cues to averaged 88dB on loud Ah, 85 on counting, 75 on reading and 72 on cognitive exercise. Tom benefited from ongoing reassurance that he is not shouting, but speaking at WNL volume when he achieves 70dB. He also benefited from my observations that his speech becomes more precise and less slurred when he speaks with intent and volume. In structured task generating 3 sentence descriptions, Tom averaged 68-70dB with usual min visual and verbal cues. In simple conversation re: his vacations, Tom averaged 70dB over 5 minutes, verbalizing awareness that he was giving effort to speak with intent and volume.    PATIENT  EDUCATION: Education details: see above, see patient education Person educated: Patient and Spouse Education method: Explanation, Demonstration, Verbal cues, and Handouts Education comprehension: verbalized understanding, returned demonstration, verbal cues required, and needs further education         GOALS: Goals reviewed with patient? Yes   SHORT TERM GOALS: Target date:  03/27/2022   Pt will report compliance with daily HEP practice (BID recommended) with mod-I over 1 week period Baseline: Goal status: INITIAL   2.  Pt will average 70+ dB during reading at paragraph level with occasional min-A over 2 sessions Baseline:  Goal status: INITIAL   3.  Pt will average 68+ dB during 7 minute structured conversation with occasional min-A over 2 sessions Baseline:  Goal status: INITIAL   4.  Pt will use dysarthria strategies of increased vocal intensity and over-articulation in noisy environment given rare min-A, in 5 minute conversation evidenced by no more than 2 requests for repetition by SLP Baseline:  Goal status: INITIAL     LONG TERM GOALS: Target date: 04/24/2022   Pt and spouse will report improved communication at home, with subjective report of less requests for repetition by wife over 1 week period Baseline:  Goal status: INITIAL   2.  Pt will self-ID volume decay in 80% of opportunities during structured speech exercise  Baseline:  Goal status: INITIAL   3.  Pt will average 70+ dB during moderately complex cognitive exercises with rare min-A over 2 sessions Baseline:  Goal status: INITIAL   4.  Pt will generalize increased vocal intensity to 20 minute conversational sample, resulting in average of 68+ dB over sample course with rare min-A Baseline:  Goal status: INITIAL     ASSESSMENT:   CLINICAL IMPRESSION: Patient is a 76 y.o. M who was seen today for motor speech evaluation upon referral from Dr. Berdine Addison. Pt presents with mild dysarthria, characterized by reduced vocal intensity (averages 63 dB, normal is 70 dB), monoloudness, and reduced amplitude of articulator movement resulting in imprecise articulation. [place] WFL, no dysfluencies evidenced, motor planning appears intact. Rate appears to be within normal limits for conversational speech. During structured exercises, rate increased. Voice is clear, without hoarseness, breathiness, or  strained quality. Overall speech has "slurred" quality and dysarthria is resulting in reduced intelligibility of approximately 90% to trained listener in quiet environment. Denies swallowing impairment, supported by recent MBSS completed 02/24/2022. Oral mechanism exam unremarkable. Trial stimulibility for improved listener perception of speech though use of increased intensity and over-articulation with pt demonstrating ability to use both in reading exercise. Volume average increased to 68 dB with occasional min-A. SLP recommends ST to address mild dysarthria in effort to improve pt's communication efficacy.   OBJECTIVE IMPAIRMENTS include dysarthria. These impairments are limiting patient from effectively communicating at home and in community. Factors affecting potential to achieve goals and functional outcome are co-morbidities(?). Patient will benefit from skilled SLP services to address above impairments and improve overall function.   REHAB POTENTIAL: Good   PLAN: SLP FREQUENCY: 2x/week   SLP DURATION: 8 weeks   PLANNED INTERVENTIONS: Cueing hierachy, Internal/external aids, Functional tasks, SLP instruction and feedback, Compensatory strategies, and Patient/family education       RENAN DANESE, CCC-SLP 03/27/2022, 2:38 PM

## 2022-03-31 ENCOUNTER — Ambulatory Visit: Payer: Medicare Other

## 2022-03-31 DIAGNOSIS — J9819 Other pulmonary collapse: Secondary | ICD-10-CM

## 2022-03-31 DIAGNOSIS — R471 Dysarthria and anarthria: Secondary | ICD-10-CM | POA: Diagnosis not present

## 2022-03-31 DIAGNOSIS — R1310 Dysphagia, unspecified: Secondary | ICD-10-CM | POA: Diagnosis not present

## 2022-03-31 NOTE — Therapy (Signed)
OUTPATIENT SPEECH LANGUAGE PATHOLOGY TREATMENT NOTE   Patient Name: Michael Spencer MRN: [id number] DOB:1946/01/21, 76 y.o., male   Today's Date: 03/31/2022  PCP: Wenda Low, MD  REFERRING PROVIDER:  Shellia Carwin, MD   END OF SESSION:   End of Session - 03/31/22 1101     Visit Number 8    Number of Visits 17    Date for SLP Re-Evaluation 04/24/22    Authorization Type Medicare    Progress Note Due on Visit 10    SLP Start Time 1018    SLP Stop Time  1100    SLP Time Calculation (min) 42 min    Activity Tolerance Patient tolerated treatment well                 Past Medical History:  Diagnosis Date   BPH (benign prostatic hyperplasia)    Broken ribs 07/09/2017   CAD (coronary artery disease) 07/09/2017   Collapsed lung 07/09/2017   DUE TO SLIPPING ON ICE IN [place] 2009   Coronary artery disease    History of kidney stones 07/09/2017   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   HTN (hypertension) 07/09/2017   Seasonal allergies 07/09/2017   Tinnitus 07/09/2017   Varicose vein of leg 07/09/2017   Past Surgical History:  Procedure Laterality Date   CORONARY ARTERY BYPASS GRAFT     HERNIA REPAIR Right 2005   [place] Left 2014   Patient Active Problem List   Diagnosis Date Noted   Monoclonal gammopathy 07/30/2021   Vitamin B12 deficiency 07/30/2021   Benign prostatic hyperplasia without lower urinary tract symptoms 05/20/2021   Allergic rhinitis 05/20/2021   Kidney stone 05/20/2021   Obstructive sleep apnea syndrome 05/20/2021   Pure hypercholesterolemia 05/20/2021   Sleep disorder 05/20/2021   HTN (hypertension) 07/09/2017   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  Rationale for Evaluation and Treatment Rehabilitation  SUBJECTIVE: "voice is good now. Normally in the morning it can be an issue. I think its impacted by when I am tried and sometimes it happens for no reason." PAIN:  Are you having pain? No  OBJECTIVE:   TODAY'S TREATMENT:  03-31-22: Pt expressed having difficulty expressing dipthongs. SLP facilitated producing dipthongs at word level and at sentence level, pt only required min-supervision A verbal cues for over-articulation on multiple syllable words verse dipthongs. Target improving vocal quality and increasing intensity through progressively difficulty speech tasks using Speak Out! program, lesson 16. ST leads pt through exercises providing occasional min A required to achieve target dB and over articulation on this date. Averages this date: loud "ah" 82 dB; reading 73 dB; cognitive speech task 73 dB. Pt self reports speech sounds the same, but pt's wife supports positive improvement in awareness of fading voice and ability to reset with intent. SLP recommends to continue working on awareness of fading speech intelligibility and to pause, take a deep  breath and speak with intent.    03-27-22: SLP provided education for anomia compensations of intentional circumlocution, use of gestures to aid in communication coherence when word finding episodes occur. SLP facilitated practice of intentional circumlocution in task where pt read 3-4 syllable word and then provided description for word. Pt accurate in 95% of trials, min-A to determine solution for remaining trials. SLP encourages pt to attempt to employ dysarthria and anomia strategies and  compensations when episodes occur when able. Pt verbalizes understanding.   03-24-22: SLP provided education on hearing and brain health. Pt reports "probably needs" hearing aids but doesn't think they'd be useful. Target improving vocal quality and increasing intensity through progressively difficulty speech tasks using Speak Out! program, lesson 12. ST leads pt through exercises providing occasional model prior to pt execution. occasional mod-A required to achieve target dB this date. Averages this date: loud "ah" 92 dB; reading 76 dB; cognitive speech task 74 dB. Conversational sample of approx 5 minutes, pt averages 71 dB with occasional min-A.Focus on over-articulation as dysarthria strategy throughout structured practice with pt able to demonstrate carryover of strategy with 90% accuracy and occasional self-correction. Pt reporting using intent through over-articulation and increased volume feels "weird," SLP provides positive feedback for naturalness sounding and increased clarity of speech. Plan to work on overarticulation on blends and mult-syllabic words at next session per pt request.    03-20-22: Target improving vocal quality and increasing intensity through progressively difficulty speech tasks using Speak Out! program, lesson 8. ST leads pt through exercises providing occasional model prior to pt execution. occasional min-A required to achieve target dB this date. Averages this date: loud "ah" 87 dB; reading 74 dB; cognitive speech task 74 dB. SLP had pt facing mirror to provide biofeedback re: overarticulation with pt demonstrating increasing awareness of decreased articulator movement impacting clarity of speech. Carryover of overarticulation demonstrated during 10/15 generative sentences with rare min-A, x3 instances of self correction. Conversational speech following structured practice requiring usual mod-A for carryover of increased vocal intensity and amplitude of articulation.     03-17-22: Pt supports slurred speech comes and goes with no correlation to internal/external factors. Target improving speech clarity and increasing intensity through progressively difficulty speech tasks using Speak Out! program, lesson 2. ST leads pt through exercises providing usual model prior to pt execution. Averages this date: loud "ah" 87 dB; reading 77 dB; cognitive speech task 69 dB. Pt demonstrated strong vocal intensity during structured task with supervision A verbal cues and in conversation min A verbal cues for carryover clarity of speech sounds. Pt supports speech quality is better today compared to last session. Pt read the "The grandfather passage" with a 52 db. SLP provided education pertaining to speaking with intent when disfluencies occur and education on continued use of Speak out!  03-13-22: Pt reports to completing HEP, despite not having received Speak Out workbook yet. Target improving speech clarity and increasing intensity through progressively difficulty speech tasks using Speak Out! program, lesson 2. ST leads pt through exercises providing usual model prior to pt execution. usual mod-A required to achieve target dB this date. Averages this date: loud "ah" 80 dB; reading 74 dB; cognitive speech task 69 dB. Conversational sample of approx 5 minutes, pt averages 69 dB with usual max-A to carryover clarity of speech sounds. SLP provides feedback for slowed rate and over-articulation to increase efficacy of intentional speech to aid in overall improved naturalness and intelligibility.    03/10/22: Ongoing education that ST will  address speech impairments and neurology will address etiology of speech impairment. Introduced [employer] Workbook and initiated training on HEP for dysarthria. Pt initially required frequent mod verbal cues and modeling to achieve target dBs for each exercise. Cues faded to usual mod visual and verbal cues to averaged 88dB on loud Ah, 85 on counting, 75  on reading and 72 on cognitive exercise. Tom benefited from ongoing reassurance that he is not shouting, but speaking at WNL volume when he achieves 70dB. He also benefited from my observations that his speech becomes more precise and less slurred when he speaks with intent and volume. In structured task generating 3 sentence descriptions, Tom averaged 68-70dB with usual min visual and verbal cues. In simple conversation re: his vacations, Tom averaged 70dB over 5 minutes, verbalizing awareness that he was giving effort to speak with intent and volume.    PATIENT EDUCATION: Education details: see above, see patient education Person educated: Patient and Spouse Education method: Explanation, Demonstration, Verbal cues, and Handouts Education comprehension: verbalized understanding, returned demonstration, verbal cues required, and needs further education         GOALS: Goals reviewed with patient? Yes   SHORT TERM GOALS: Target date: 03/27/2022   Pt will report compliance with daily HEP practice (BID recommended) with mod-I over 1 week period Baseline: 02/18/22, 03/31/22 Goal status: MET   2.  Pt will average 70+ dB during reading at paragraph level with occasional min-A over 2 sessions Baseline: 02/18/22, 03/31/22 Goal status: MET   3.  Pt will average 68+ dB during 7 minute structured conversation with occasional min-A over 2 sessions Baseline: 02/18/22, 03/31/22 Goal status: MET   4.  Pt will use dysarthria strategies of increased vocal intensity and over-articulation in noisy environment given rare min-A, in 5 minute conversation evidenced by no more than 2 requests for repetition by SLP Baseline: 02/18/22, 03/31/22 Goal status: MET     LONG TERM GOALS: Target date: 04/24/2022   Pt and spouse will report improved communication at home, with subjective report of less requests for repetition by wife over 1 week period Baseline:  Goal status: INITIAL   2.  Pt will self-ID volume  decay in 80% of opportunities during structured speech exercise  Baseline:  Goal status: INITIAL   3.  Pt will average 70+ dB during moderately complex cognitive exercises with rare min-A over 2 sessions Baseline:  Goal status: INITIAL   4.  Pt will generalize increased vocal intensity to 20 minute conversational sample, resulting in average of 68+ dB over sample course with rare min-A Baseline:  Goal status: INITIAL     ASSESSMENT:   CLINICAL IMPRESSION: Patient is a 76 y.o. M who was seen today for motor speech evaluation upon referral from Dr. Berdine Addison. Pt presents with mild dysarthria, characterized by reduced vocal intensity (averages 63 dB, normal is 70 dB), monoloudness, and reduced amplitude of articulator movement resulting in imprecise articulation. [place] WFL, no dysfluencies evidenced, motor planning appears intact. Rate appears to be within normal limits for conversational speech. During structured exercises, rate increased. Voice is clear, without hoarseness, breathiness, or strained quality. Overall speech has "slurred" quality and dysarthria is resulting in reduced intelligibility of approximately 90% to trained listener in quiet environment. Denies swallowing impairment, supported by recent MBSS completed 02/24/2022. Oral mechanism exam unremarkable. Trial stimulibility for improved listener perception of speech though use of increased intensity and over-articulation with pt demonstrating ability to use both in reading exercise. Volume average increased to 68  dB with occasional min-A. 03/30/22 pt requires min A verbal cues for over-articulation and to increase vocal intensity. SLP recommends ST to address mild dysarthria in effort to improve pt's communication efficacy.   OBJECTIVE IMPAIRMENTS include dysarthria. These impairments are limiting patient from effectively communicating at home and in community. Factors affecting potential to achieve goals and functional outcome are  co-morbidities(?). Patient will benefit from skilled SLP services to address above impairments and improve overall function.   REHAB POTENTIAL: Good   PLAN: SLP FREQUENCY: 2x/week   SLP DURATION: 8 weeks   PLANNED INTERVENTIONS: Cueing hierachy, Internal/external aids, Functional tasks, SLP instruction and feedback, Compensatory strategies, and Patient/family education       [place], [place] 03/31/2022, 11:05 AM

## 2022-04-02 ENCOUNTER — Ambulatory Visit: Payer: Medicare Other | Attending: Neurology | Admitting: Speech Pathology

## 2022-04-02 DIAGNOSIS — R471 Dysarthria and anarthria: Secondary | ICD-10-CM | POA: Diagnosis not present

## 2022-04-02 NOTE — Therapy (Signed)
OUTPATIENT SPEECH LANGUAGE PATHOLOGY TREATMENT NOTE   Patient Name: Michael Spencer MRN: [id number] DOB:12/29/1945, 76 y.o., male   Today's Date: 04/02/2022  PCP: Husain, [name], MD  REFERRING PROVIDER:  Hill, Jeremy L, MD   END OF SESSION:   End of Session - 04/02/22 1015     Visit Number 9    Number of Visits 17    Date for SLP Re-Evaluation 04/24/22    Authorization Type Medicare    Progress Note Due on Visit 10    SLP Start Time 1015    SLP Stop Time  1053    SLP Time Calculation (min) 38 min    Activity Tolerance Patient tolerated treatment well                  Past Medical History:  Diagnosis Date   BPH (benign prostatic hyperplasia)    Broken ribs 07/09/2017   CAD (coronary artery disease) 07/09/2017   Collapsed lung 07/09/2017   DUE TO SLIPPING ON ICE IN [place] 2009   Coronary artery disease    History of kidney stones 07/09/2017   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   HTN (hypertension) 07/09/2017   Seasonal allergies 07/09/2017   Tinnitus 07/09/2017   Varicose vein of leg 07/09/2017   Past Surgical History:  Procedure Laterality Date   CORONARY ARTERY BYPASS GRAFT     HERNIA REPAIR Right 2005   NOSE SURGERY  1981   TRANSURETHRAL RESECTION OF PROSTATE  1988   VARICOSE VEIN SURGERY Left 2014   Patient Active Problem List   Diagnosis Date Noted   Monoclonal gammopathy 07/30/2021   Vitamin B12 deficiency 07/30/2021   Benign prostatic hyperplasia without lower urinary tract symptoms 05/20/2021   Allergic rhinitis 05/20/2021   Kidney stone 05/20/2021   Obstructive sleep apnea syndrome 05/20/2021   Pure hypercholesterolemia 05/20/2021   Sleep disorder 05/20/2021   HTN (hypertension) 07/09/2017   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   CAD (coronary artery disease) 07/09/2017   Varicose vein of leg 07/09/2017   History of kidney stones 07/09/2017   Broken ribs 07/09/2017   Collapsed lung 07/09/2017   Tinnitus 07/09/2017    Seasonal allergies 07/09/2017    ONSET DATE: referral date 02/18/2022, pt reporting onset of speech change occurring Oct 2022   REFERRING DIAG:  F48.2 (ICD-10-CM) - Pseudobulbar affect  R47.81 (ICD-10-CM) - Slurred speech  R47.1 (ICD-10-CM) - Dysarthria     THERAPY DIAG:  Dysarthria  Rationale for Evaluation and Treatment Rehabilitation  SUBJECTIVE: "I'm catching myself more"   PAIN:  Are you having pain? No  SPEECH THERAPY DISCHARGE SUMMARY  Visits from Start of Care: 9  Current functional level related to goals / functional outcomes: Improved awareness of speech production and it's impact on listener's ability to comprehend. With improved awareness, pt able to self-correct and implement learned dysarthria strategies up to discourse level with occasional min-A from SLP. Pt reports overall improved communication efficacy, aside from ongoing occasional instances of pseudobulbar affect.   Remaining deficits: Dysarthria, most consistent with hypokinetic subtype    Education / Equipment: Dysarthria strategies and compensations, HEP   Patient agrees to discharge. Patient goals were met. Patient is being discharged due to being pleased with the current functional level..   OBJECTIVE:   TODAY'S TREATMENT:  04-02-22: Reviewed dysarthria strategies and compensations aimed at improved communication efficacy. Pt able to verbalize strategies via teach back. SLP A pt in volume calibration with feedback re: increased effort and volume resulting in normal volume.   Encouraged ongoing practice to aid in generalization of strategies to conversational speech with reduction in need to self-correct. Pt appreciative of interventions, agreeable to ST d/c this date.   03-31-22: Pt expressed having difficulty expressing dipthongs. SLP facilitated producing dipthongs at word level and at sentence level, pt only required min-supervision A verbal cues for over-articulation on multiple syllable words  verse dipthongs. Target improving vocal quality and increasing intensity through progressively difficulty speech tasks using Speak Out! program, lesson 16. ST leads pt through exercises providing occasional min A required to achieve target dB and over articulation on this date. Averages this date: loud "ah" 82 dB; reading 73 dB; cognitive speech task 73 dB. Pt self reports speech sounds the same, but pt's wife supports positive improvement in awareness of fading voice and ability to reset with intent. SLP recommends to continue working on awareness of fading speech intelligibility and to pause, take a deep breath and speak with intent.    03-27-22: SLP provided education for anomia compensations of intentional circumlocution, use of gestures to aid in communication coherence when word finding episodes occur. SLP facilitated practice of intentional circumlocution in task where pt read 3-4 syllable word and then provided description for word. Pt accurate in 95% of trials, min-A to determine solution for remaining trials. SLP encourages pt to attempt to employ dysarthria and anomia strategies and compensations when episodes occur when able. Pt verbalizes understanding.   03-24-22: SLP provided education on hearing and brain health. Pt reports "probably needs" hearing aids but doesn't think they'd be useful. Target improving vocal quality and increasing intensity through progressively difficulty speech tasks using Speak Out! program, lesson 12. ST leads pt through exercises providing occasional model prior to pt execution. occasional mod-A required to achieve target dB this date. Averages this date: loud "ah" 92 dB; reading 76 dB; cognitive speech task 74 dB. Conversational sample of approx 5 minutes, pt averages 71 dB with occasional min-A.Focus on over-articulation as dysarthria strategy throughout structured practice with pt able to demonstrate carryover of strategy with 90% accuracy and occasional  self-correction. Pt reporting using intent through over-articulation and increased volume feels "weird," SLP provides positive feedback for naturalness sounding and increased clarity of speech. Plan to work on overarticulation on blends and mult-syllabic words at next session per pt request.    03-20-22: Target improving vocal quality and increasing intensity through progressively difficulty speech tasks using Speak Out! program, lesson 8. ST leads pt through exercises providing occasional model prior to pt execution. occasional min-A required to achieve target dB this date. Averages this date: loud "ah" 87 dB; reading 74 dB; cognitive speech task 74 dB. SLP had pt facing mirror to provide biofeedback re: overarticulation with pt demonstrating increasing awareness of decreased articulator movement impacting clarity of speech. Carryover of overarticulation demonstrated during 10/15 generative sentences with rare min-A, x3 instances of self correction. Conversational speech following structured practice requiring usual mod-A for carryover of increased vocal intensity and amplitude of articulation.    03-17-22: Pt supports slurred speech comes and goes with no correlation to internal/external factors. Target improving speech clarity and increasing intensity through progressively difficulty speech tasks using Speak Out! program, lesson 2. ST leads pt through exercises providing usual model prior to pt execution. Averages this date: loud "ah" 87 dB; reading 77 dB; cognitive speech task 69 dB. Pt demonstrated strong vocal intensity during structured task with supervision A verbal cues and in conversation min A verbal cues for carryover clarity of speech sounds. Pt supports   speech quality is better today compared to last session. Pt read the "The grandfather passage" with a 70 db. SLP provided education pertaining to speaking with intent when disfluencies occur and education on continued use of Speak  out!  03-13-22: Pt reports to completing HEP, despite not having received Speak Out workbook yet. Target improving speech clarity and increasing intensity through progressively difficulty speech tasks using Speak Out! program, lesson 2. ST leads pt through exercises providing usual model prior to pt execution. usual mod-A required to achieve target dB this date. Averages this date: loud "ah" 80 dB; reading 74 dB; cognitive speech task 69 dB. Conversational sample of approx 5 minutes, pt averages 69 dB with usual max-A to carryover clarity of speech sounds. SLP provides feedback for slowed rate and over-articulation to increase efficacy of intentional speech to aid in overall improved naturalness and intelligibility.    03/10/22: Ongoing education that ST will address speech impairments and neurology will address etiology of speech impairment. Introduced Speak Out! Workbook and initiated training on HEP for dysarthria. Pt initially required frequent mod verbal cues and modeling to achieve target dBs for each exercise. Cues faded to usual mod visual and verbal cues to averaged 88dB on loud Ah, 85 on counting, 75 on reading and 72 on cognitive exercise. Michael Spencer benefited from ongoing reassurance that he is not shouting, but speaking at WNL volume when he achieves 70dB. He also benefited from my observations that his speech becomes more precise and less slurred when he speaks with intent and volume. In structured task generating 3 sentence descriptions, Michael Spencer averaged 68-70dB with usual min visual and verbal cues. In simple conversation re: his vacations, Michael Spencer averaged 70dB over 5 minutes, verbalizing awareness that he was giving effort to speak with intent and volume.    PATIENT EDUCATION: Education details: see above, see patient education Person educated: Patient and Spouse Education method: Explanation, Demonstration, Verbal cues, and Handouts Education comprehension: verbalized understanding, returned  demonstration, verbal cues required, and needs further education         GOALS: Goals reviewed with patient? Yes   SHORT TERM GOALS: Target date: 03/27/2022   Pt will report compliance with daily HEP practice (BID recommended) with mod-I over 1 week period Baseline: 02/18/22, 03/31/22 Goal status: MET   2.  Pt will average 70+ dB during reading at paragraph level with occasional min-A over 2 sessions Baseline: 02/18/22, 03/31/22 Goal status: MET   3.  Pt will average 68+ dB during 7 minute structured conversation with occasional min-A over 2 sessions Baseline: 02/18/22, 03/31/22 Goal status: MET   4.  Pt will use dysarthria strategies of increased vocal intensity and over-articulation in noisy environment given rare min-A, in 5 minute conversation evidenced by no more than 2 requests for repetition by SLP Baseline: 02/18/22, 03/31/22 Goal status: MET     LONG TERM GOALS: Target date: 04/24/2022   Pt and spouse will report improved communication at home, with subjective report of less requests for repetition by wife over 1 week period Baseline:  Goal status: MET   2.  Pt will self-ID volume decay in 80% of opportunities during structured speech exercise  Baseline:  Goal status: MET  3.  Pt will average 70+ dB during moderately complex cognitive exercises with rare min-A over 2 sessions Baseline:  Goal status: MET   4.  Pt will generalize increased vocal intensity to 20 minute conversational sample, resulting in average of 68+ dB over sample course with rare min-A Baseline:    Goal status: MET   ASSESSMENT:   CLINICAL IMPRESSION: Michael Spencer demonstrates ongoing, mild dysarthria, characterized by reduced vocal intensity (averages 63 dB, normal is 70 dB), monoloudness, and reduced amplitude of articulator movement resulting in imprecise articulation. SLP provided education and training in use of dysarthria strategies of increased volume and overarticulation. Additional education and  training on compensations to aid in improved communication efficacy, with pt able to ID modifications which have been beneficial for improved communication at home. Pt pleased with current status, will continue to practice strategies with HEP and seek new referral should needs change.    OBJECTIVE IMPAIRMENTS include dysarthria. These impairments are limiting patient from effectively communicating at home and in community. Factors affecting potential to achieve goals and functional outcome are co-morbidities(?). Patient will benefit from skilled SLP services to address above impairments and improve overall function.   REHAB POTENTIAL: Good   PLAN: SLP FREQUENCY: 2x/week   SLP DURATION: 8 weeks   PLANNED INTERVENTIONS: Cueing hierachy, Internal/external aids, Functional tasks, SLP instruction and feedback, Compensatory strategies, and Patient/family education       Jessica L Zechariah, CCC-SLP 04/02/2022, 10:20 AM 

## 2022-05-10 NOTE — Progress Notes (Deleted)
Cardiology Office Note   Date:  05/10/2022   ID:  Michael Spencer, DOB 03/10/1946, MRN [id number]  PCP:  Wenda Low, MD  Cardiologist:   Dorris Carnes, MD   Pt presents for f/u of CAD     History of Present Illness: Michael Spencer is a 76 y.o. male with a history of CAD (s/p CABG in 2012), HTN, HL, varicose veins . In 2012 he woke up   Arm stiff  Next day called doctor Sent to hosp  Catheterization done   Next day had CABG [facility] ) The pt moved to [place] in 2018   I last saw him in 2021  Since seen he has done OK from a cardiac standpoint   No CP   Breathing is OK  No dizziness   BP at home 109-120s  /  He says it is higher in doctor's office (white coat effect) Diet:   Br   7 AM   Life cereal   Lunch:   Sandwich Dinner:   5 or 6 PM   Meat and salad   Drinks:   Water   One or 2 Dr Samson Frederic per week     Since the patient was in clinic he has done well.  His breathing has been good.  He denies chest pain.  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  He says his blood pressure is usually good in the 120s.  When he does things it increases.  Wife says he is going to have an MRI for some slurring of speech.  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 His smoking use included cigarettes. He has never used smokeless tobacco. He reports that he does not currently use alcohol. He reports that he does not use drugs.   Family History:  The patient's family history includes CAD in his brother, sister, and sister; Cancer in his father; Cancer - Other in his mother; Diabetes in his father; Hypertension in his father; Lymphoma in his brother.    ROS:  Please see the history of present illness. All other systems are reviewed and  Negative to the above problem except as noted.    PHYSICAL EXAM: VS:  There were no vitals taken for this visit.  BP on my check 130/84     GEN: Well nourished, well developed, in no acute distress  HEENT: normal  Neck: JVP is not elevated Cardiac: RRR; no murmurs.  No edema  Respiratory:  clear to auscultation bilaterally,  GI: soft, nontender, nondistended, + BS  No hepatomegaly  MS: no deformity Moving all extremities  Skin: warm and dry, no rash Neuro:  Strength and sensation are intact Psych: euthymic mood, full affect   EKG:  EKG shows SR 71 bpm   Occasional PVC    Lipid Panel No results found for: "CHOL", "TRIG", "HDL", "CHOLHDL", "VLDL", "LDLCALC", "LDLDIRECT"    Wt Readings from Last 3 Encounters:  02/18/22 204 lb (92.5 kg)  11/28/21 203 lb (92.1 kg)  11/19/21 203 lb 11.2 oz (92.4 kg)      ASSESSMENT AND PLAN:  1  CAD    CABG in 2012  Has done well since  Myovue done when he lived in [place]  in 2018   No ischemia   Patient remains asymtpomatic     2  HTN  BP is elevated   He says white coat.  REcomm he bring cuff to next MD visit to confirm  3  HL   LDL 42  HDL 39    4  Obesity   Discussed diet    5 CV dz   Mild plaquing in 2019  Had been stable    F/U in 1 year       Current medicines are reviewed at length with the patient today.  The patient does not have concerns regarding medicines.  Signed, Dorris Carnes, MD  05/10/2022 10:04 PM    [address], [place], [place]  [address] Phone: [phone]; Fax: [phone]

## 2022-05-12 ENCOUNTER — Ambulatory Visit: Payer: Medicare Other | Admitting: Internal Medicine

## 2022-05-13 ENCOUNTER — Inpatient Hospital Stay: Payer: Medicare Other | Attending: Hematology and Oncology

## 2022-05-13 ENCOUNTER — Other Ambulatory Visit: Payer: Self-pay | Admitting: Hematology and Oncology

## 2022-05-13 DIAGNOSIS — Z800 Family history of malignant neoplasm of digestive organs: Secondary | ICD-10-CM | POA: Diagnosis not present

## 2022-05-13 DIAGNOSIS — Z8249 Family history of ischemic heart disease and other diseases of the circulatory system: Secondary | ICD-10-CM | POA: Diagnosis not present

## 2022-05-13 DIAGNOSIS — Z885 Allergy status to narcotic agent status: Secondary | ICD-10-CM | POA: Diagnosis not present

## 2022-05-13 DIAGNOSIS — Z79899 Other long term (current) drug therapy: Secondary | ICD-10-CM | POA: Insufficient documentation

## 2022-05-13 DIAGNOSIS — N400 Enlarged prostate without lower urinary tract symptoms: Secondary | ICD-10-CM | POA: Diagnosis not present

## 2022-05-13 DIAGNOSIS — Z87891 Personal history of nicotine dependence: Secondary | ICD-10-CM | POA: Insufficient documentation

## 2022-05-13 DIAGNOSIS — Z833 Family history of diabetes mellitus: Secondary | ICD-10-CM | POA: Insufficient documentation

## 2022-05-13 DIAGNOSIS — Z87442 Personal history of urinary calculi: Secondary | ICD-10-CM | POA: Diagnosis not present

## 2022-05-13 DIAGNOSIS — D472 Monoclonal gammopathy: Secondary | ICD-10-CM | POA: Diagnosis not present

## 2022-05-13 LAB — CMP (CANCER CENTER ONLY)
ALT: 12 U/L (ref 0–44)
AST: 12 U/L — ABNORMAL LOW (ref 15–41)
Albumin: 3.7 g/dL (ref 3.5–5.0)
Alkaline Phosphatase: 66 U/L (ref 38–126)
Anion gap: 5 (ref 5–15)
BUN: 14 mg/dL (ref 8–23)
CO2: 29 mmol/L (ref 22–32)
Calcium: 9 mg/dL (ref 8.9–10.3)
Chloride: 103 mmol/L (ref 98–111)
Creatinine: 0.89 mg/dL (ref 0.61–1.24)
GFR, Estimated: >60 mL/min (ref >60)
Glucose, Bld: 114 mg/dL — ABNORMAL HIGH (ref 70–99)
Potassium: 3.9 mmol/L (ref 3.5–5.1)
Sodium: 137 mmol/L (ref 135–145)
Total Bilirubin: 0.9 mg/dL (ref 0.3–1.2)
Total Protein: 6.5 g/dL (ref 6.5–8.1)

## 2022-05-13 LAB — CBC WITH DIFFERENTIAL (CANCER CENTER ONLY)
Abs Immature Granulocytes: 0.02 10*3/uL (ref 0.00–0.07)
Basophils Absolute: 0 10*3/uL (ref 0.0–0.1)
Basophils Relative: 0 %
Eosinophils Absolute: 0 10*3/uL (ref 0.0–0.5)
Eosinophils Relative: 1 %
HCT: 43.8 % (ref 39.0–52.0)
Hemoglobin: 14.4 g/dL (ref 13.0–17.0)
Immature Granulocytes: 0 %
Lymphocytes Relative: 16 %
Lymphs Abs: 1.1 10*3/uL (ref 0.7–4.0)
MCH: 29.3 pg (ref 26.0–34.0)
MCHC: 32.9 g/dL (ref 30.0–36.0)
MCV: 89 fL (ref 80.0–100.0)
Monocytes Absolute: 0.6 10*3/uL (ref 0.1–1.0)
Monocytes Relative: 8 %
Neutro Abs: 5 10*3/uL (ref 1.7–7.7)
Neutrophils Relative %: 75 %
Platelet Count: 180 10*3/uL (ref 150–400)
RBC: 4.92 MIL/uL (ref 4.22–5.81)
RDW: 14.8 % (ref 11.5–15.5)
WBC Count: 6.8 10*3/uL (ref 4.0–10.5)
nRBC: 0 % (ref 0.0–0.2)

## 2022-05-13 LAB — LACTATE DEHYDROGENASE: LDH: 115 U/L (ref 98–192)

## 2022-05-14 LAB — KAPPA/LAMBDA LIGHT CHAINS
Kappa free light chain: 39 mg/L — ABNORMAL HIGH (ref 3.3–19.4)
Kappa, lambda light chain ratio: 1.43 (ref 0.26–1.65)
Lambda free light chains: 27.3 mg/L — ABNORMAL HIGH (ref 5.7–26.3)

## 2022-05-14 LAB — BETA 2 MICROGLOBULIN, SERUM: Beta-2 Microglobulin: 1.5 mg/L (ref 0.6–2.4)

## 2022-05-19 DIAGNOSIS — H43813 Vitreous degeneration, bilateral: Secondary | ICD-10-CM | POA: Diagnosis not present

## 2022-05-19 DIAGNOSIS — H04123 Dry eye syndrome of bilateral lacrimal glands: Secondary | ICD-10-CM | POA: Diagnosis not present

## 2022-05-19 DIAGNOSIS — H2513 Age-related nuclear cataract, bilateral: Secondary | ICD-10-CM | POA: Diagnosis not present

## 2022-05-19 DIAGNOSIS — H40013 Open angle with borderline findings, low risk, bilateral: Secondary | ICD-10-CM | POA: Diagnosis not present

## 2022-05-19 LAB — MULTIPLE MYELOMA PANEL, SERUM
Albumin SerPl Elph-Mcnc: 3.2 g/dL (ref 2.9–4.4)
Albumin/Glob SerPl: 1.1 (ref 0.7–1.7)
Alpha 1: 0.2 g/dL (ref 0.0–0.4)
Alpha2 Glob SerPl Elph-Mcnc: 0.6 g/dL (ref 0.4–1.0)
B-Globulin SerPl Elph-Mcnc: 1.2 g/dL (ref 0.7–1.3)
Gamma Glob SerPl Elph-Mcnc: 1.2 g/dL (ref 0.4–1.8)
Globulin, Total: 3.2 g/dL (ref 2.2–3.9)
IgA: 605 mg/dL — ABNORMAL HIGH (ref 61–437)
IgG (Immunoglobin G), Serum: 1295 mg/dL (ref 603–1613)
IgM (Immunoglobulin M), Srm: 72 mg/dL (ref 15–143)
Total Protein ELP: 6.4 g/dL (ref 6.0–8.5)

## 2022-05-20 ENCOUNTER — Inpatient Hospital Stay (HOSPITAL_BASED_OUTPATIENT_CLINIC_OR_DEPARTMENT_OTHER): Payer: Medicare Other | Admitting: Hematology and Oncology

## 2022-05-20 VITALS — BP 147/90 | HR 51 | Temp 97.3°F | Resp 17 | Wt 202.4 lb

## 2022-05-20 DIAGNOSIS — D472 Monoclonal gammopathy: Secondary | ICD-10-CM | POA: Diagnosis not present

## 2022-05-20 DIAGNOSIS — Z79899 Other long term (current) drug therapy: Secondary | ICD-10-CM | POA: Diagnosis not present

## 2022-05-20 DIAGNOSIS — N400 Enlarged prostate without lower urinary tract symptoms: Secondary | ICD-10-CM | POA: Diagnosis not present

## 2022-05-20 DIAGNOSIS — Z87442 Personal history of urinary calculi: Secondary | ICD-10-CM | POA: Diagnosis not present

## 2022-05-20 DIAGNOSIS — Z87891 Personal history of nicotine dependence: Secondary | ICD-10-CM | POA: Diagnosis not present

## 2022-05-20 DIAGNOSIS — Z885 Allergy status to narcotic agent status: Secondary | ICD-10-CM | POA: Diagnosis not present

## 2022-05-20 NOTE — Progress Notes (Signed)
[place] [phone]   [phone]  PROGRESS NOTE  Patient Care Team: Wenda Low, MD as PCP - General (Internal Medicine) Fay Records, MD as PCP - Cardiology (Cardiology) Tat, Eustace Quail, DO as Consulting Physician (Neurology)  Hematological/Oncological History # IgA Kappa Monoclonal Gammopathy of Undetermined Significance.  06/26/2021: SPEP detected M protein measuring 0.5 g/dL.  Immunofixation shows a poorly-defined area of restricted protein mobility is detected and is reactive with IgA and kappa.   07/29/2021: Establish care with Dede Query PA-C and Dr. Lorenso Courier  08/12/2021: bone marrow biopsy shows 5% plasma cells.   Interval History:  Michael Spencer 76 y.o. male with medical history significant for IgA kappa MGUS who presents for a follow up visit. The patient's last visit was on 11/19/2021. In the interim since the last visit he has had no major changes in his health.   On exam today Michael Spencer is accompanied by his wife.  He reports he has been well in the interim since our last visit.  He has had no major changes in his health in the last 6 months.  He reports that he was prescribed a medication "to help with my emotions and keep me from crying" but was too expensive as a cause $1500.  He reports that his energy levels have been good and his appetite is strong.  He notes that he is not having any urinary issues such as dark urine or bubbling of the urine.  He does urinate approximately 3 times per night and has previously been evaluated by urology for BPH.  Overall he is at his baseline level of health with no recent changes.  He otherwise denies any fevers, chills, sweats, nausea, vomiting or diarrhea.  Full 10 point ROS is listed below.  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   HTN (hypertension) 07/09/2017   Seasonal allergies 07/09/2017   Tinnitus 07/09/2017   Varicose vein of leg 07/09/2017    SURGICAL HISTORY: Past Surgical History:  Procedure Laterality Date   CORONARY ARTERY BYPASS GRAFT     HERNIA REPAIR Right 2005   NOSE SURGERY  1981   TRANSURETHRAL RESECTION OF PROSTATE  1988   VARICOSE VEIN SURGERY Left 2014    SOCIAL HISTORY: Social History   Socioeconomic History   Marital status: Married    Spouse name: Not on file   Number of children: Not on file   Years of education: Not on file   Highest education level: Not on file  Occupational History   Occupation: retired    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, academic  Tobacco Use   Smoking status: Former    Types: Cigarettes   Smokeless tobacco: Never  Vaping Use   Vaping Use: Never used  Substance and Sexual Activity   Alcohol use: Not Currently    Comment: rarely   Drug use: No   Sexual activity: Not on file  Other Topics Concern   Not on file  Social History Narrative   Right handed   Lives with wife one story home   Retired    No Caffeine   Social Determinants of Health   Financial  Resource Strain: Not on file  Food Insecurity: Not on file  Transportation Needs: Not on file  Physical Activity: Not on file  Stress: Not on file  Social Connections: Not on file  Intimate Partner Violence: Not on file    FAMILY HISTORY: Family History  Problem Relation Age of Onset   Cancer - Other Mother        LIVER CANCER   Cancer Father        PANCREATIC   Diabetes Father    Hypertension Father    CAD Sister    CAD Sister    CAD Brother    Lymphoma Brother     ALLERGIES:  is allergic to percocet [oxycodone-acetaminophen].  MEDICATIONS:  Current Outpatient Medications  Medication Sig Dispense Refill   aspirin EC 81 MG tablet Take 81  mg by mouth daily.     atorvastatin (LIPITOR) 40 MG tablet Take 40 mg at bedtime by mouth.  3   Dextromethorphan-quiNIDine (NUEDEXTA) 20-10 MG capsule Take 1 capsule by mouth daily. 13 capsule 0   losartan (COZAAR) 25 MG tablet Take 25 mg daily by mouth.  2   metoprolol tartrate (LOPRESSOR) 25 MG tablet Take 12.5 mg 2 (two) times daily by mouth.  1   No current facility-administered medications for this visit.    REVIEW OF SYSTEMS:   Constitutional: ( - ) fevers, ( - )  chills , ( - ) night sweats Eyes: ( - ) blurriness of vision, ( - ) double vision, ( - ) watery eyes Ears, nose, mouth, throat, and face: ( - ) mucositis, ( - ) sore throat Respiratory: ( - ) cough, ( - ) dyspnea, ( - ) wheezes Cardiovascular: ( - ) palpitation, ( - ) chest discomfort, ( - ) lower extremity swelling Gastrointestinal:  ( - ) nausea, ( - ) heartburn, ( - ) change in bowel habits Skin: ( - ) abnormal skin rashes Lymphatics: ( - ) new lymphadenopathy, ( - ) easy bruising Neurological: ( - ) numbness, ( - ) tingling, ( - ) new weaknesses Behavioral/Psych: ( - ) mood change, ( - ) new changes  All other systems were reviewed with the patient and are negative.  PHYSICAL EXAMINATION:  Vitals:   05/20/22 1004  BP: (!) 147/90  Pulse: (!) 51  Resp: 17  Temp: (!) 97.3 F (36.3 C)  SpO2: 99%   Filed Weights   05/20/22 1004  Weight: 202 lb 6.4 oz (91.8 kg)    GENERAL: Well-appearing elderly Caucasian male, alert, no distress and comfortable SKIN: skin color, texture, turgor are normal, no rashes or significant lesions EYES: conjunctiva are pink and non-injected, sclera clear LUNGS: clear to auscultation and percussion with normal breathing effort HEART: regular rate & rhythm and no murmurs and no lower extremity edema Musculoskeletal: no cyanosis of digits and no clubbing  PSYCH: alert & oriented x 3, fluent speech NEURO: no focal motor/sensory deficits  LABORATORY DATA:  I have reviewed the data as  listed    Latest Ref Rng & Units 05/13/2022   10:11 AM 11/19/2021    9:44 AM 08/12/2021    8:25 AM  CBC  WBC 4.0 - 10.5 K/uL 6.8  6.0  6.5   Hemoglobin 13.0 - 17.0 g/dL 14.4  13.6  12.8   Hematocrit 39.0 - 52.0 % 43.8  41.8  40.7   Platelets 150 - 400 K/uL 180  214  183        Latest Ref Rng &  Units 05/13/2022   10:11 AM 11/19/2021    9:44 AM 08/12/2021    8:25 AM  CMP  Glucose 70 - 99 mg/dL 114  111  101   BUN 8 - 23 mg/dL _0 Creatinine 0.61 - 1.24 mg/dL 0.89  1.05  1.13   Sodium 135 - 145 mmol/L 137  136  137   Potassium 3.5 - 5.1 mmol/L 3.9  4.1  4.9   Chloride 98 - 111 mmol/L 103  104  103   CO2 22 - 32 mmol/L _1 Calcium 8.9 - 10.3 mg/dL 9.0  9.1  9.1   Total Protein 6.5 - 8.1 g/dL 6.5  7.3  6.7   Total Bilirubin 0.3 - 1.2 mg/dL 0.9  1.0  0.9   Alkaline Phos 38 - 126 U/L 66  75  73   AST 15 - 41 U/L _2 ALT 0 - 44 U/L _3 Lab Results  Component Value Date   MPROTEIN Comment (A) 05/13/2022   MPROTEIN Comment (A) 11/19/2021   MPROTEIN Comment (A) 07/29/2021   Lab Results  Component Value Date   KPAFRELGTCHN 39.0 (H) 05/13/2022   KPAFRELGTCHN 40.1 (H) 11/19/2021   KPAFRELGTCHN 41.2 (H) 07/29/2021   LAMBDASER 27.3 (H) 05/13/2022   LAMBDASER 27.6 (H) 11/19/2021   LAMBDASER 30.0 (H) 07/29/2021   KAPLAMBRATIO 1.43 05/13/2022   KAPLAMBRATIO 1.45 11/19/2021   KAPLAMBRATIO 9.39 07/31/2021    RADIOGRAPHIC STUDIES: No results found.  ASSESSMENT & PLAN [place] Pounders 76 y.o. male with medical history significant for IgA kappa MGUS who presents for a follow up visit.   #IgA Kappa Monoclonal Gammopathy of Undetermined Significance -- At each visit will order an SPEP, SFLC and beta 2 microglobulin --additionally will collect CBC, CMP, and LDH --recommend a metastatic bone survey to assess for lytic lesions at least yearly.  Next due March 2023 --UPEP to be collected yearly, next March 2023 --Bone marrow biopsy performed in March  2023 showed 5% plasma cells.  Findings most consistent with MGUS --Labs today show white blood cell count 6.8, hemoglobin 14.4, platelet count 180, and MCV 89.  Creatinine 0.89 --IFE shows IgA monoclonal Gammopathy. Unable to quantify M protein. SFLC ratio is WNL.  --Return to clinic in 6 months time.   No orders of the defined types were placed in this encounter.   All questions were answered. The patient knows to call the clinic with any problems, questions or concerns.  A total of more than 30 minutes were spent on this encounter with face-to-face time and non-face-to-face time, including preparing to see the patient, ordering tests and/or medications, counseling the patient and coordination of care as outlined above.   Ledell Peoples, MD Department of Hematology/Oncology [place] [facility] Phone: [phone] Pager: [phone] Email: [email or web address]  05/20/2022 10:10 AM

## 2022-06-02 DIAGNOSIS — K080 Exfoliation of teeth due to systemic causes: Secondary | ICD-10-CM | POA: Diagnosis not present

## 2022-06-23 ENCOUNTER — Ambulatory Visit: Payer: Medicare Other | Admitting: Internal Medicine

## 2022-06-30 ENCOUNTER — Other Ambulatory Visit (HOSPITAL_COMMUNITY): Payer: Self-pay

## 2022-06-30 NOTE — Progress Notes (Unsigned)
Cardiology Office Note   Date:  07/04/2022   ID:  Michael Spencer, DOB Aug 16, 1945, MRN [id number]  PCP:  Wenda Low, MD  Cardiologist:   Dorris Carnes, MD   Pt presents for f/u of CAD     History of Present Illness: Michael Spencer is a 77 y.o. male with a history of CAD (s/p CABG in 2012), HTN, HL, varicose veins . In 2012 he woke up   Arm stiff  Next day called doctor Sent to hosp  Catheterization done   Next day had CABG [facility] ) I saw the pt in Dec 2022  SInce seen hte pt has done OK from a cardiac standpoint   Breathing is OK  He denies CP   No palpitaitons He is being seen in neuorology for dysarthria, diminished sensation in distal LE extremities, diplopia THis has troubled him signficantly   Allergies:   Percocet [oxycodone-acetaminophen]   Past Medical History:  Diagnosis Date   BPH (benign prostatic hyperplasia)    Broken ribs 07/09/2017   CAD (coronary artery disease) 07/09/2017   Collapsed lung 07/09/2017   DUE TO SLIPPING ON ICE IN [place] 2009   Coronary artery disease    History of kidney stones 07/09/2017   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 His smoking use included cigarettes. He has never used smokeless tobacco. He reports that he does not currently use alcohol. He reports that he does not use drugs.   Family History:  The patient's family history includes CAD in his brother, sister, and sister; Cancer in his father; Cancer - Other in his mother; Diabetes in his father; Hypertension in his father; Lymphoma in his brother.    ROS:  Please see the history  of present illness. All other systems are reviewed and  Negative to the above problem except as noted.    PHYSICAL EXAM: VS:  BP 136/84   Pulse (!) 52   Ht '6\' 1"'$  (1.854 m)   Wt 199 lb (90.3 kg)   SpO2 97%   BMI 26.25 kg/m    GEN: Well nourished, well developed, in NAD HEENT: normal  Neck: JVP is not elevated  No bruit Cardiac: RRR; no murmurs.  No  LE edema  Respiratory:  clear to auscultation bilaterally,  GI: soft, nontender, nondistended, + BS  No hepatomegaly  MS: no deformity Moving all extremities   Skin: warm and dry, no rash Neuro:  Alert and oriented   Speech intemitt dysarthric Psych: euthymic mood, full affect   EKG:  EKG shows SB 52 bpm  First degree AV block PR 220 msec     Lipid Panel No results found for: "CHOL", "TRIG", "HDL", "CHOLHDL", "VLDL", "LDLCALC", "LDLDIRECT"    Wt Readings from Last 3 Encounters:  07/03/22 199 lb (90.3 kg)  05/20/22 202 lb 6.4 oz (91.8 kg)  02/18/22 204 lb (92.5 kg)      ASSESSMENT AND PLAN:  1  CAD    CABG in 2012  Myovue done when he lived in [facility]  in 2018   No ischemia    Pt without symptoms of chest tightnes or SOB    Follow    2  HTN  BP is very mildly elevated    He says better at home (white coat)   Wonders if he can consolidate meds    I think he can stop metoprolol and follow, esp with bradycardia     Continue losartan  3  HL  will check lipids today   On lipitor    4  CV dz   Mild plaquing in 2019   5  Neuro   Follow in neuro clinic with [facility]  Encouraged physical actiivty    Watch diet      F/U in 1 year       Current medicines are reviewed at length with the patient today.  The patient does not have concerns regarding medicines.  Signed, Dorris Carnes, MD  07/04/2022 7:08 AM    [facility] [address], [place], [place]  [address] Phone: [phone]; Fax: [phone]

## 2022-07-03 ENCOUNTER — Ambulatory Visit: Payer: Medicare Other | Attending: Internal Medicine | Admitting: Internal Medicine

## 2022-07-03 ENCOUNTER — Encounter: Payer: Self-pay | Admitting: Internal Medicine

## 2022-07-03 VITALS — BP 136/84 | HR 52 | Ht 73.0 in | Wt 199.0 lb

## 2022-07-03 DIAGNOSIS — I2510 Atherosclerotic heart disease of native coronary artery without angina pectoris: Secondary | ICD-10-CM | POA: Diagnosis not present

## 2022-07-03 NOTE — Patient Instructions (Addendum)
Medication Instructions:  Your physician recommends that you continue on your current medications as directed. Please refer to the Current Medication list given to you today.  *If you need a refill on your cardiac medications before your next appointment, please call your pharmacy*  Lab Work Your physician recommends that you have lab work today- vitamin D, NMR lipo, A1c  If you have labs (blood work) drawn today and your tests are completely normal, you will receive your results only by: MyChart Message (if you have MyChart) OR A paper copy in the mail If you have any lab test that is abnormal or we need to change your treatment, we will call you to review the results.   Testing/Procedures: None ordered today.  Follow-Up: At [facility], you and your health needs are our priority.  As part of our continuing mission to provide you with exceptional heart care, we have created designated Provider Care Teams.  These Care Teams include your primary Cardiologist (physician) and Advanced Practice Providers (APPs -  Physician Assistants and Nurse Practitioners) who all work together to provide you with the care you need, when you need it.  We recommend signing up for the patient portal called "MyChart".  Sign up information is provided on this After Visit Summary.  MyChart is used to connect with patients for Virtual Visits (Telemedicine).  Patients are able to view lab/test results, encounter notes, upcoming appointments, etc.  Non-urgent messages can be sent to your provider as well.   To learn more about what you can do with MyChart, go to [email or web address].    Your next appointment:   1 year(s)  Provider:   Dorris Carnes, MD

## 2022-07-05 LAB — HEMOGLOBIN A1C
Est. average glucose Bld gHb Est-mCnc: 120 mg/dL
Hgb A1c MFr Bld: 5.8 % — ABNORMAL HIGH (ref 4.8–5.6)

## 2022-07-05 LAB — NMR, LIPOPROFILE
Cholesterol, Total: 100 mg/dL (ref 100–199)
HDL Particle Number: 21.8 umol/L — ABNORMAL LOW (ref >=30.5)
HDL-C: 40 mg/dL (ref >39)
LDL Particle Number: 523 nmol/L (ref <1000)
LDL Size: 20.9 nm (ref >20.5)
LDL-C (NIH Calc): 44 mg/dL (ref 0–99)
LP-IR Score: 33 (ref <=45)
Small LDL Particle Number: 287 nmol/L (ref <=527)
Triglycerides: 79 mg/dL (ref 0–149)

## 2022-07-05 LAB — VITAMIN D 25 HYDROXY (VIT D DEFICIENCY, FRACTURES): Vit D, 25-Hydroxy: 18.4 ng/mL — ABNORMAL LOW (ref 30.0–100.0)

## 2022-07-06 ENCOUNTER — Other Ambulatory Visit: Payer: Self-pay

## 2022-07-06 DIAGNOSIS — Z79899 Other long term (current) drug therapy: Secondary | ICD-10-CM

## 2022-07-06 DIAGNOSIS — I2510 Atherosclerotic heart disease of native coronary artery without angina pectoris: Secondary | ICD-10-CM

## 2022-07-06 DIAGNOSIS — E559 Vitamin D deficiency, unspecified: Secondary | ICD-10-CM

## 2022-07-06 DIAGNOSIS — E7800 Pure hypercholesterolemia, unspecified: Secondary | ICD-10-CM

## 2022-07-06 MED ORDER — VITAMIN D (ERGOCALCIFEROL) 1.25 MG (50000 UNIT) PO CAPS: 50000.0000 [IU] | ORAL_CAPSULE | ORAL | 3 refills | Status: DC

## 2022-07-06 MED ORDER — ATORVASTATIN CALCIUM 20 MG PO TABS: 20.0000 mg | ORAL_TABLET | Freq: Every day | ORAL | 3 refills | Status: DC

## 2022-07-08 ENCOUNTER — Encounter: Payer: Self-pay | Admitting: Internal Medicine

## 2022-07-13 DIAGNOSIS — H524 Presbyopia: Secondary | ICD-10-CM | POA: Diagnosis not present

## 2022-07-28 DIAGNOSIS — Z0000 Encounter for general adult medical examination without abnormal findings: Secondary | ICD-10-CM | POA: Diagnosis not present

## 2022-07-28 DIAGNOSIS — Z1331 Encounter for screening for depression: Secondary | ICD-10-CM | POA: Diagnosis not present

## 2022-07-28 DIAGNOSIS — E559 Vitamin D deficiency, unspecified: Secondary | ICD-10-CM | POA: Diagnosis not present

## 2022-07-28 DIAGNOSIS — E7800 Pure hypercholesterolemia, unspecified: Secondary | ICD-10-CM | POA: Diagnosis not present

## 2022-07-28 DIAGNOSIS — N400 Enlarged prostate without lower urinary tract symptoms: Secondary | ICD-10-CM | POA: Diagnosis not present

## 2022-07-28 DIAGNOSIS — E538 Deficiency of other specified B group vitamins: Secondary | ICD-10-CM | POA: Diagnosis not present

## 2022-07-28 DIAGNOSIS — G4733 Obstructive sleep apnea (adult) (pediatric): Secondary | ICD-10-CM | POA: Diagnosis not present

## 2022-07-28 DIAGNOSIS — I10 Essential (primary) hypertension: Secondary | ICD-10-CM | POA: Diagnosis not present

## 2022-08-13 NOTE — Progress Notes (Signed)
I saw Michael Spencer in neurology clinic on 08/19/22 in follow up for dysarthria.  HPI: Michael Spencer is a 77 y.o. year old right-handed male with a medical history of HLD, HTN, CAD s/p CABG, deviated septum s/p surgical correction who we last saw on 02/18/22.  To briefly review: Patient has had slurred speech for about 1 year. It has slowly progressed since 1 year ago. He also does not have as good of balance as he used to have. Per patient and family, his symptoms fluctuate. He can have days that there is not slurred speech. There is no clear pattern or triggers. His imbalance does not fluctuate. He sometimes has difficulty getting words out when talking. He will know the word but cannot say it.   Muscle bulk loss? Not clearly losing muscle He has cramping in his calves. He does not have significant twitching. He has some sensory changes in bilateral feet. He denies significant pain. Suggestion of myotonia/difficulty relaxing after contraction? No  Fatigable weakness? No Does strength improve after brief exercise? No  Able to brush hair/teeth without difficulty? Yes  Able to button shirts/use zips? Yes  Clumsiness/dropping grasped objects? No Can you arise from squatted position easily? Yes  Able to get out of chair without using arms? Yes  Able to walk up steps easily? Yes Use an assistive device to walk? No  Significant imbalance with walking? Yes  Falls? No   Any change in urine color, especially after exertion/physical activity? No   He endorses occasional double vision that will resolve with covering one eye (once every 2 weeks). He can squint and have the symptoms resolve. He denies ptosis.   He denies difficulty chewing. He endorses occasional choking when drinking water in the morning. Wife thinks patient is coughing or choking more often.   There are no neuromuscular respiratory weakness symptoms, particularly orthopnea>dyspnea.    Pseudobulbar affect is present. This was  present prior to the slurred speech, perhaps 1-2 years in duration.   The patient does not report symptoms referable to autonomic dysfunction including impaired sweating, excessive mucosal dryness, gastroparetic early satiety, postprandial abdominal bloating, constipation, bowel or bladder dyscontrol, or syncope/presyncope/orthostatic intolerance.   He endorses cold intolerance. He gets cold quicker than wife.   The patient has not noticed any recent skin rashes nor does he report any constitutional symptoms like fever, night sweats. He has lost 20 lbs over the last year. Per wife, they had a new dog over the same period in which the patient is walking the dog 3 times per day. They think this is the reason for the weight loss.   EtOH use: Rare  Restrictive diet? No Family history of neuropathy/myopathy/NM disease? Brother with Parkinson's disease. Father had cerebellar degeneration (he would fall when walking, no voice changes; he was an alcoholic)   Patient was previously seen in this office by Dr. Carles Collet, initially on 05/20/2021. History per that clinic note: "[name] was seen today in the movement disorders clinic for neurologic consultation at the request of Wenda Low, MD.  The consultation is for the evaluation of balance difficulty, gait change and to rule out Parkinson's disease.  Medical records made available to me are reviewed.  Patient actually feels that that gait and balance change have been getting better. Patient feels that biggest issue is that he was having intermittent speech change.  Patient states that he would have episodes where he knew what he would like to say but could not get the  proper words out.  Each episode would last between 1 and 5 minutes per medical records made available to me..   Today, patient and family state that sx's started 3 months ago ago.  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  That  has actually gotten better and it is not there any longer per pt but daughter isn't convinced it is gone.  No falls.  About 1.5 months ago, he noted slow onset of speech change.   He states that speech change comes and goes.  It lasts a max of 5 min per wife.  Speech change consists of slurred speech.  He denies trouble with word finding but daughter states that he has had episodes of word finding trouble.  He doesn't know what would bring on an episodes.   Specific Symptoms:  Tremor: No. Family hx of similar:  Yes.   brother with Parkinsons Disease ; father with "degeneration of the cerebellum" (? SCA) Voice: weak/soft Sleep: sleeps well             Vivid Dreams:  No.             Acting out dreams:  No. Wet Pillows: No. Postural symptoms:  Yes.               Falls?  No. Bradykinesia symptoms: difficulty getting out of a chair; no shuffling; no drooling Loss of smell:  No. Loss of taste:  No. Urinary Incontinence:  No. Difficulty Swallowing:  No. Handwriting, micrographia: No. Trouble with ADL's:  No.             Trouble buttoning clothing: No. Depression:  pt states normal; wife states little depressed Memory changes:  some short term changes Hallucinations:  No.             visual distortions: No. N/V:  No. Lightheaded:  No.             Syncope: No. Diplopia:  none now - had in remote past - perhaps over year ago Dyskinesia:  Yes.     MRI brain was completed on May 09, 2021.  There were chronic lacunar infarctions in the right cerebellum, left corona radiata and left lentiform nucleus."   Work up has included AChR ab (neg), MRI brain w/wo with small vessel disease and lacunar infarcts in left corona radiata, lentiform nucleus, and right cerebellum. EMG was completed on 06/24/21 that showed chronic right C7 radiculopathy without more diffuse process. Repeat EMG on 01/01/22 was similar without evidence of motor neuron disease. M protein was found on immunofixation. Patient saw  oncology who did a bone marrow biopsy and diagnosed patient with MGUS. He was also put on B12 supplementation for deficiency.  Most recent Assessment and Plan (02/18/22): The etiology of patient's symptoms is currently unclear. Most of patient's symptoms are consistent with an upper motor neuron process, though his dysarthria seems to have mixed features. While he has cerebellar pathology on MRI brain (lacunar infarct), I am doubtful that this would explain all of his symptoms. It may explain his choppy pursuit with EOM and RAM difficulty on the left though. His father had "cerebellar degeneration" but was also an alcoholic, which could explain that. His MRI does not clearly show cerebellar degeneration, but this is a consideration. He does not have evidence of upper or lower motor neuron changes in his limbs to suggest ALS, however, this could be bulbar onset ALS. Given no  significant changes over 6 months on EMG, this is not clearly the case though. PLS is also possible. Myasthenia gravis would be unlikely to explain of his symptoms, however, given the fluctuating nature of symptoms as reported by patient and family, it is reasonable to check antibodies today. I explained that long term follow up and monitoring is needed to better determine etiology of symptoms.   His sensory loss in bilateral distal lower extremities may be related to monoclonal gammopathy or borderline B12, but is likely unrelated to bulbar symptoms.   PLAN: -Blood work: AChR abs (binding, blocking, modulating) -Modified barium swallow to assess swallowing function -Speech therapy referral -Trial of Nuedexta (sample given today) - 1 tablet daily (20/10 mg). Patient will let me know if it helps and if he wants to continue it -Offered second opinion at academic center such as [facility], etc. Patient and family will consider it. -Patient instructed to call or message if having new or worsening symptoms  Since their last  visit: Patient had MBS on 02/24/22. There was no oral or oropharyngeal dysphagia seen. Patient will cough when drinking cold water first thing in the morning, but denies difficulty swallowing otherwise.  AChR abs were negative.  Patient went to speech therapy. He learned skills to help him be better understood.  Nuedexta did help patient. Medication was approved by insurance through 2023. It was $650/month, so patient did not fill the prescription. He was not that bothered by symptoms.  Patient denies any weakness. He denies changes to cognition.  Patient continues to follow with Dr. Lorenso Courier for IgA Kappa MGUS.   MEDICATIONS:  Outpatient Encounter Medications as of 08/19/2022  Medication Sig   aspirin EC 81 MG tablet Take 81 mg by mouth daily.   atorvastatin (LIPITOR) 20 MG tablet Take 1 tablet (20 mg total) by mouth daily.   losartan (COZAAR) 25 MG tablet Take 25 mg daily by mouth.   nitroGLYCERIN (NITROSTAT) 0.4 MG SL tablet Place 0.4 mg under the tongue every 5 (five) minutes as needed for chest pain.   Vitamin D, Ergocalciferol, (DRISDOL) 1.25 MG (50000 UNIT) CAPS capsule Take 1 capsule (50,000 Units total) by mouth every 7 (seven) days.   [DISCONTINUED] Dextromethorphan-quiNIDine (NUEDEXTA) 20-10 MG capsule Take 1 capsule by mouth daily. (Patient not taking: Reported on 08/19/2022)   No facility-administered encounter medications on file as of 08/19/2022.    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   HTN (hypertension) 07/09/2017   Seasonal allergies 07/09/2017   Tinnitus 07/09/2017   Varicose vein of leg 07/09/2017    PAST SURGICAL HISTORY: Past Surgical History:  Procedure  Laterality Date   CORONARY ARTERY BYPASS GRAFT     HERNIA REPAIR Right 2005   NOSE SURGERY  1981   TRANSURETHRAL RESECTION OF PROSTATE  1988   VARICOSE VEIN SURGERY Left 2014    ALLERGIES: Allergies  Allergen Reactions   Percocet [Oxycodone-Acetaminophen] Other (See Comments)    hallucinations    FAMILY HISTORY: Family History  Problem Relation Age of Onset   Cancer - Other Mother        LIVER CANCER   Cancer Father  PANCREATIC   Diabetes Father    Hypertension Father    CAD Sister    CAD Sister    CAD Brother    Lymphoma Brother     SOCIAL HISTORY: Social History   Tobacco Use   Smoking status: Former    Types: Cigarettes   Smokeless tobacco: Never  Vaping Use   Vaping Use: Never used  Substance Use Topics   Alcohol use: Not Currently    Comment: rarely   Drug use: No   Social History   Social History Narrative   Right handed   Lives with wife one story home   Retired    No Caffeine    Objective:  Vital Signs:  BP (!) 152/87   Pulse 62   Ht 6\' 1"  (1.854 m)   Wt 200 lb (90.7 kg)   SpO2 (!) 62%   BMI 26.39 kg/m   General: General appearance: Awake and alert. No distress. Cooperative with exam.  Skin: No obvious rash or jaundice. HEENT: Atraumatic. Anicteric. Lungs: Non-labored breathing on room air  Extremities: No edema. No obvious deformity.  Psych: Flat affect  Neurological: Mental Status: Alert. Speech fluent. No obvious pseudobulbar affect today Cranial Nerves: CNII: No RAPD. Visual fields intact. CNIII, IV, VI: PERRL. No nystagmus. EOMI. Choppy pursuit. CN V: Facial sensation intact bilaterally to fine touch. Masseter clench strong. Jaw jerk negative. CN VII: Facial muscles symmetric and strong. No ptosis at rest. CN VIII: Hears finger rub well bilaterally. CN IX: No hypophonia. CN X: Palate elevates symmetrically. CN XI: Full strength shoulder shrug bilaterally. CN XII: Tongue protrusion full and midline. No atrophy or  fasciculations. Mild mixed dysarthria, unchanged from prior Motor: Tone is normal. No fasciculations in any extremities. No atrophy.  Individual muscle group testing (MRC grade out of 5):  Movement     Neck flexion 5    Neck extension 5     Right Left   Shoulder abduction 5 5   Elbow flexion 5 5   Elbow extension 5 5   Finger abduction - FDI 5 5   Finger abduction - ADM 5 5   Finger extension 5 5   Finger distal flexion - 2/3 5 5    Finger distal flexion - 4/5 5 5    Thumb flexion - FPL 5 5   Thumb abduction - APB 5- 5-    Hip flexion 5 5   Hip extension 5 5   Hip adduction 5 5   Hip abduction 5 5   Knee extension 5 5   Knee flexion 5 5   Dorsiflexion 5 5   Plantarflexion 5 5    Reflexes:  Right Left  Bicep 2+ 2+  Tricep 2+ 2+  BrRad 2+ 2+  Knee 2+ 2+  Ankle 1+ 1+   Pathological Reflexes: Babinski: flexor response bilaterally Hoffman: absent bilaterally Troemner: absent bilaterally Sensation: Pinprick: Diminished in right > left foot (similar to prior)  Coordination: Intact finger-to- nose-finger bilaterally. Romberg negative. Gait: Normal, narrow-based gait.    Lab and Test Review: New results: 07/03/22: Vit D: 18.4 HbA1c: 5.8  MBS (02/24/22): Pt demonstrates no oral or oropharyngeal dysphagia. No penetration, aspiration or residual. Pts speech was slurred throughout assessment. Geographic tongue and slight left lingual deviation noted. Pt safe to continue a regular diet and thin liquids. Encouraged appt with SLP for dysarthria interventions. No f/u needed for swallowing at this time.   Previously reviewed results: Normal or unremarkable: lyme, heavy metals, PTH, copper, TSH,  LDH MG panel (05/20/21): negative IFE and SPEP (06/26/21): poorly defined region of restricted mobility (IgA kappa) B12 (06/26/21): 231; repeat on 07/29/21: 582 with normal MMA     MRI cervical spine wo contrast (07/14/21): FINDINGS: Alignment: Straightening and slight reversal of the  normal cervical lordosis. Trace anterolisthesis at C2-C3.   Vertebrae: No fracture, evidence of discitis, or bone lesion. Small hemangioma at base of the dens.   Cord: Normal signal and morphology.   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 Otherwise negative.   Disc levels:   C2-C3: Negative disc. Moderate right and mild left facet arthropathy. No stenosis.   C3-C4: Tiny shallow broad-based posterior disc protrusion slightly eccentric to the right. Mild right uncovertebral hypertrophy. Moderate bilateral facet arthropathy. Mild right neuroforaminal stenosis. No spinal canal or left neuroforaminal stenosis.   C4-C5: Negative disc. Moderate right and mild left facet arthropathy. No stenosis.   C5-C6: Small posterior disc osteophyte complex eccentric to the left. Left greater than right facet uncovertebral hypertrophy. Slight flattening of the left ventral cord with borderline mild spinal canal stenosis. Moderate to severe left neuroforaminal stenosis. No right neuroforaminal stenosis.   C6-C7: Small posterior disc osteophyte complex and mild bilateral facet uncovertebral hypertrophy. Borderline mild spinal canal stenosis. No neuroforaminal stenosis.   C7-T1: Negative disc. Moderate bilateral facet arthropathy. No stenosis.   IMPRESSION: 1. Multilevel degenerative changes of the cervical spine as described above. Moderate to severe left neuroforaminal stenosis at C5-C6. No significant spinal canal stenosis at any level.   MRI brain w/wo contrast (05/09/21): FINDINGS: Brain: Cerebral volume is within normal limits for age. No restricted diffusion to suggest acute infarction. No midline shift, mass effect, evidence of mass lesion, ventriculomegaly, extra-axial collection or acute intracranial hemorrhage. Cervicomedullary junction and pituitary are within normal limits.   There is a small  chronic lacunar infarct tracking from the left corona radiata to the left lentiform. This is facilitated on diffusion. And there is a small chronic infarct in the posterior cerebellum on the right (series 13, image 6). But elsewhere generally normal for age gray and white matter signal throughout the brain. No cortical encephalomalacia or chronic cerebral blood products identified. There is minimal T2 heterogeneity in the right pons.   No abnormal enhancement identified (minimal central pontine capillary telangiectasia, normal variant). No dural thickening.   Vascular: Major intracranial vascular flow voids are preserved. The distal left vertebral artery appears to be dominant. The major dural venous sinuses are enhancing and appear to be patent.   Skull and upper cervical spine: Negative for age visible cervical spine. Visualized bone marrow signal is within normal limits.   Sinuses/Orbits: Negative orbits. Only trace paranasal sinus mucosal thickening.   Other: Mastoid air cells are clear. Visible internal auditory structures appear normal. Negative visible scalp and face.   IMPRESSION: 1. No acute or subacute intracranial abnormality identified. 2. There are small chronic small vessel/lacunar type infarcts in the left corona radiata and lentiform, and right cerebellum.   EMG 06/24/21: Impression: Chronic C7 radiculopathy affecting the right upper extremity, mild. In isolation, mild chronic neurogenic changes involving the hyoglossus muscle are of unclear clinical significance.  Repeat electrodiagnostic testing in 6 months, if clinically indicated. There is no evidence of sensorimotor neuropathy, wide spread disorder of anterior horn cells, or diffuse myopathy.   EMG 01/01/22: Impression: As compared to prior study on 06/24/2021, there has been no change significant change.  Findings show: Chronic C7 radiculopathy  affecting the right upper extremity, mild and unchanged. In  isolation, mild chronic neurogenic changes involving the hyoglossus muscle is of unclear clinical significance.  Right ulnar neuropathy with slowing across the elbow, demyelinating, mild, is new.  There is no evidence of sensorimotor neuropathy, wide spread disorder of anterior horn cells, or diffuse myopathy.  ASSESSMENT: This is [employer], a 77 y.o. male with dysarthria and pseudobulbar affect. B12 was borderline previously. EMG x2 showed no evidence of MND. AChR abs for MG were negative. MRI brain showed lacunar infarcts in left corona radiata and lentiform nucleus and right cerebellum. They etiology of symptoms is currently unclear. There is no current evidence of motor neuron disease or parkinsonism.   Plan: -Will continue to closely monitor symptoms for progression -May consider repeating EMG if new weakness develops -Continue vit D and B12 supplementation -Patient instructed to call if having new or worsening symptoms  Return to clinic in 6 months or sooner if needed  Total time spent reviewing records, interview, history/exam, documentation, and coordination of care on day of encounter:  30 min  Jacquelyne Balint, MD

## 2022-08-19 ENCOUNTER — Encounter: Payer: Self-pay | Admitting: Neurology

## 2022-08-19 ENCOUNTER — Ambulatory Visit: Payer: Medicare Other | Admitting: Neurology

## 2022-08-19 VITALS — BP 120/80 | HR 62 | Ht 73.0 in | Wt 200.0 lb

## 2022-08-19 DIAGNOSIS — F482 Pseudobulbar affect: Secondary | ICD-10-CM | POA: Diagnosis not present

## 2022-08-19 DIAGNOSIS — R471 Dysarthria and anarthria: Secondary | ICD-10-CM | POA: Diagnosis not present

## 2022-08-19 DIAGNOSIS — R4781 Slurred speech: Secondary | ICD-10-CM | POA: Diagnosis not present

## 2022-08-19 DIAGNOSIS — D472 Monoclonal gammopathy: Secondary | ICD-10-CM

## 2022-08-19 NOTE — Patient Instructions (Signed)
Your examination today is similar to last time I saw you. You seem stable currently.  I would like to see you back in about 6 months to recheck your symptoms. If you have new or worsening symptoms before that, please call our office, as we can discuss and see you earlier if needed.  The physicians and staff at [facility] are committed to providing excellent care. You may receive a survey requesting feedback about your experience at our office. We strive to receive "very good" responses to the survey questions. If you feel that your experience would prevent you from giving the office a "very good " response, please contact our office to try to remedy the situation. We may be reached at [phone]. Thank you for taking the time out of your busy day to complete the survey.   Kai Levins, MD [facility]

## 2022-10-07 DIAGNOSIS — K080 Exfoliation of teeth due to systemic causes: Secondary | ICD-10-CM | POA: Diagnosis not present

## 2022-10-28 DIAGNOSIS — K5730 Diverticulosis of large intestine without perforation or abscess without bleeding: Secondary | ICD-10-CM | POA: Diagnosis not present

## 2022-10-28 DIAGNOSIS — D124 Benign neoplasm of descending colon: Secondary | ICD-10-CM | POA: Diagnosis not present

## 2022-10-28 DIAGNOSIS — K641 Second degree hemorrhoids: Secondary | ICD-10-CM | POA: Diagnosis not present

## 2022-10-28 DIAGNOSIS — Z86010 Personal history of colonic polyps: Secondary | ICD-10-CM | POA: Diagnosis not present

## 2022-10-28 DIAGNOSIS — K644 Residual hemorrhoidal skin tags: Secondary | ICD-10-CM | POA: Diagnosis not present

## 2022-10-28 DIAGNOSIS — Z09 Encounter for follow-up examination after completed treatment for conditions other than malignant neoplasm: Secondary | ICD-10-CM | POA: Diagnosis not present

## 2022-10-28 DIAGNOSIS — D123 Benign neoplasm of transverse colon: Secondary | ICD-10-CM | POA: Diagnosis not present

## 2022-10-30 DIAGNOSIS — D124 Benign neoplasm of descending colon: Secondary | ICD-10-CM | POA: Diagnosis not present

## 2022-10-30 DIAGNOSIS — D123 Benign neoplasm of transverse colon: Secondary | ICD-10-CM | POA: Diagnosis not present

## 2022-11-04 ENCOUNTER — Ambulatory Visit: Payer: Medicare Other | Attending: Internal Medicine

## 2022-11-04 DIAGNOSIS — Z79899 Other long term (current) drug therapy: Secondary | ICD-10-CM | POA: Diagnosis not present

## 2022-11-04 DIAGNOSIS — I2510 Atherosclerotic heart disease of native coronary artery without angina pectoris: Secondary | ICD-10-CM

## 2022-11-04 DIAGNOSIS — E7800 Pure hypercholesterolemia, unspecified: Secondary | ICD-10-CM | POA: Diagnosis not present

## 2022-11-04 DIAGNOSIS — E559 Vitamin D deficiency, unspecified: Secondary | ICD-10-CM

## 2022-11-05 LAB — NMR, LIPOPROFILE
Cholesterol, Total: 123 mg/dL (ref 100–199)
HDL Particle Number: 28.5 umol/L — ABNORMAL LOW (ref >=30.5)
HDL-C: 49 mg/dL (ref >39)
LDL Particle Number: 578 nmol/L (ref <1000)
LDL Size: 20.8 nm (ref >20.5)
LDL-C (NIH Calc): 58 mg/dL (ref 0–99)
LP-IR Score: 33 (ref <=45)
Small LDL Particle Number: 302 nmol/L (ref <=527)
Triglycerides: 80 mg/dL (ref 0–149)

## 2022-11-05 LAB — VITAMIN D 25 HYDROXY (VIT D DEFICIENCY, FRACTURES): Vit D, 25-Hydroxy: 53.4 ng/mL (ref 30.0–100.0)

## 2022-11-09 ENCOUNTER — Other Ambulatory Visit: Payer: Self-pay

## 2022-11-09 ENCOUNTER — Telehealth: Payer: Self-pay

## 2022-11-09 MED ORDER — SM VITAMIN D3 100 MCG (4000 UT) PO CAPS: 4000.0000 [IU] | ORAL_CAPSULE | Freq: Every day | ORAL | 3 refills | Status: DC

## 2022-11-09 NOTE — Telephone Encounter (Signed)
OK 

## 2022-11-09 NOTE — Telephone Encounter (Signed)
Pt advised his lab results and he and his wife declined increasing the Atorvastatin to 40 mg since she says he had terrible body aches in the increased dose and has been doing better on the 20 mg.   Will send to Dr Tenny Craw.

## 2022-11-09 NOTE — Telephone Encounter (Signed)
-----   Message from Dietrich Pates V, MD sent at 11/06/2022  4:10 PM EDT ----- LDL is a little higher at 58   I would go back to lipitor 40 Vit D is very good  I would swithc to Vit D 4000 or 5000 units per day

## 2022-12-02 DIAGNOSIS — K080 Exfoliation of teeth due to systemic causes: Secondary | ICD-10-CM | POA: Diagnosis not present

## 2023-01-26 DIAGNOSIS — I10 Essential (primary) hypertension: Secondary | ICD-10-CM | POA: Diagnosis not present

## 2023-01-26 DIAGNOSIS — G4733 Obstructive sleep apnea (adult) (pediatric): Secondary | ICD-10-CM | POA: Diagnosis not present

## 2023-01-26 DIAGNOSIS — I2510 Atherosclerotic heart disease of native coronary artery without angina pectoris: Secondary | ICD-10-CM | POA: Diagnosis not present

## 2023-01-26 DIAGNOSIS — E7800 Pure hypercholesterolemia, unspecified: Secondary | ICD-10-CM | POA: Diagnosis not present

## 2023-02-19 ENCOUNTER — Ambulatory Visit: Payer: Medicare Other | Admitting: Neurology

## 2023-05-18 ENCOUNTER — Inpatient Hospital Stay: Payer: Medicare Other | Attending: Hematology and Oncology

## 2023-05-18 ENCOUNTER — Other Ambulatory Visit: Payer: Self-pay | Admitting: Hematology and Oncology

## 2023-05-18 DIAGNOSIS — D472 Monoclonal gammopathy: Secondary | ICD-10-CM | POA: Diagnosis not present

## 2023-05-18 LAB — CMP (CANCER CENTER ONLY)
ALT: 14 U/L (ref 0–44)
AST: 14 U/L — ABNORMAL LOW (ref 15–41)
Albumin: 3.9 g/dL (ref 3.5–5.0)
Alkaline Phosphatase: 71 U/L (ref 38–126)
Anion gap: 5 (ref 5–15)
BUN: 19 mg/dL (ref 8–23)
CO2: 29 mmol/L (ref 22–32)
Calcium: 8.9 mg/dL (ref 8.9–10.3)
Chloride: 103 mmol/L (ref 98–111)
Creatinine: 0.94 mg/dL (ref 0.61–1.24)
GFR, Estimated: >60 mL/min (ref >60)
Glucose, Bld: 105 mg/dL — ABNORMAL HIGH (ref 70–99)
Potassium: 3.8 mmol/L (ref 3.5–5.1)
Sodium: 137 mmol/L (ref 135–145)
Total Bilirubin: 1.1 mg/dL (ref <1.2)
Total Protein: 6.9 g/dL (ref 6.5–8.1)

## 2023-05-18 LAB — CBC WITH DIFFERENTIAL (CANCER CENTER ONLY)
Abs Immature Granulocytes: 0.01 10*3/uL (ref 0.00–0.07)
Basophils Absolute: 0 10*3/uL (ref 0.0–0.1)
Basophils Relative: 0 %
Eosinophils Absolute: 0 10*3/uL (ref 0.0–0.5)
Eosinophils Relative: 1 %
HCT: 46.1 % (ref 39.0–52.0)
Hemoglobin: 15.3 g/dL (ref 13.0–17.0)
Immature Granulocytes: 0 %
Lymphocytes Relative: 16 %
Lymphs Abs: 1 10*3/uL (ref 0.7–4.0)
MCH: 30.5 pg (ref 26.0–34.0)
MCHC: 33.2 g/dL (ref 30.0–36.0)
MCV: 91.8 fL (ref 80.0–100.0)
Monocytes Absolute: 0.5 10*3/uL (ref 0.1–1.0)
Monocytes Relative: 8 %
Neutro Abs: 4.6 10*3/uL (ref 1.7–7.7)
Neutrophils Relative %: 75 %
Platelet Count: 211 10*3/uL (ref 150–400)
RBC: 5.02 MIL/uL (ref 4.22–5.81)
RDW: 13 % (ref 11.5–15.5)
WBC Count: 6.1 10*3/uL (ref 4.0–10.5)
nRBC: 0 % (ref 0.0–0.2)

## 2023-05-18 LAB — LACTATE DEHYDROGENASE: LDH: 129 U/L (ref 98–192)

## 2023-05-19 LAB — KAPPA/LAMBDA LIGHT CHAINS
Kappa free light chain: 41 mg/L — ABNORMAL HIGH (ref 3.3–19.4)
Kappa, lambda light chain ratio: 1.31 (ref 0.26–1.65)
Lambda free light chains: 31.2 mg/L — ABNORMAL HIGH (ref 5.7–26.3)

## 2023-05-21 ENCOUNTER — Telehealth: Payer: Self-pay | Admitting: Hematology and Oncology

## 2023-05-25 ENCOUNTER — Inpatient Hospital Stay: Payer: Medicare Other | Admitting: Hematology and Oncology

## 2023-05-30 LAB — MULTIPLE MYELOMA PANEL, SERUM
Albumin SerPl Elph-Mcnc: 3.6 g/dL (ref 2.9–4.4)
Albumin/Glob SerPl: 1.1 (ref 0.7–1.7)
Alpha 1: 0.3 g/dL (ref 0.0–0.4)
Alpha2 Glob SerPl Elph-Mcnc: 0.6 g/dL (ref 0.4–1.0)
B-Globulin SerPl Elph-Mcnc: 1.4 g/dL — ABNORMAL HIGH (ref 0.7–1.3)
Gamma Glob SerPl Elph-Mcnc: 1.2 g/dL (ref 0.4–1.8)
Globulin, Total: 3.4 g/dL (ref 2.2–3.9)
IgA: 615 mg/dL — ABNORMAL HIGH (ref 61–437)
IgG (Immunoglobin G), Serum: 1336 mg/dL (ref 603–1613)
IgM (Immunoglobulin M), Srm: 72 mg/dL (ref 15–143)
Total Protein ELP: 7 g/dL (ref 6.0–8.5)

## 2023-06-01 DIAGNOSIS — H43813 Vitreous degeneration, bilateral: Secondary | ICD-10-CM | POA: Diagnosis not present

## 2023-06-01 DIAGNOSIS — H40013 Open angle with borderline findings, low risk, bilateral: Secondary | ICD-10-CM | POA: Diagnosis not present

## 2023-06-01 DIAGNOSIS — H04123 Dry eye syndrome of bilateral lacrimal glands: Secondary | ICD-10-CM | POA: Diagnosis not present

## 2023-06-01 DIAGNOSIS — H2513 Age-related nuclear cataract, bilateral: Secondary | ICD-10-CM | POA: Diagnosis not present

## 2023-06-08 ENCOUNTER — Inpatient Hospital Stay: Payer: Medicare Other | Attending: Hematology and Oncology | Admitting: Hematology and Oncology

## 2023-06-08 VITALS — BP 150/81 | HR 57 | Temp 97.9°F | Resp 16 | Wt 196.6 lb

## 2023-06-08 DIAGNOSIS — Z833 Family history of diabetes mellitus: Secondary | ICD-10-CM | POA: Insufficient documentation

## 2023-06-08 DIAGNOSIS — I10 Essential (primary) hypertension: Secondary | ICD-10-CM | POA: Diagnosis not present

## 2023-06-08 DIAGNOSIS — Z8249 Family history of ischemic heart disease and other diseases of the circulatory system: Secondary | ICD-10-CM | POA: Insufficient documentation

## 2023-06-08 DIAGNOSIS — L859 Epidermal thickening, unspecified: Secondary | ICD-10-CM | POA: Insufficient documentation

## 2023-06-08 DIAGNOSIS — Z87442 Personal history of urinary calculi: Secondary | ICD-10-CM | POA: Insufficient documentation

## 2023-06-08 DIAGNOSIS — I2510 Atherosclerotic heart disease of native coronary artery without angina pectoris: Secondary | ICD-10-CM | POA: Insufficient documentation

## 2023-06-08 DIAGNOSIS — Z807 Family history of other malignant neoplasms of lymphoid, hematopoietic and related tissues: Secondary | ICD-10-CM | POA: Diagnosis not present

## 2023-06-08 DIAGNOSIS — Z951 Presence of aortocoronary bypass graft: Secondary | ICD-10-CM | POA: Diagnosis not present

## 2023-06-08 DIAGNOSIS — Z79899 Other long term (current) drug therapy: Secondary | ICD-10-CM | POA: Insufficient documentation

## 2023-06-08 DIAGNOSIS — D472 Monoclonal gammopathy: Secondary | ICD-10-CM | POA: Diagnosis not present

## 2023-06-08 DIAGNOSIS — Z885 Allergy status to narcotic agent status: Secondary | ICD-10-CM | POA: Insufficient documentation

## 2023-06-08 DIAGNOSIS — E538 Deficiency of other specified B group vitamins: Secondary | ICD-10-CM | POA: Diagnosis not present

## 2023-06-08 DIAGNOSIS — Z800 Family history of malignant neoplasm of digestive organs: Secondary | ICD-10-CM | POA: Diagnosis not present

## 2023-06-08 DIAGNOSIS — Z87891 Personal history of nicotine dependence: Secondary | ICD-10-CM | POA: Insufficient documentation

## 2023-06-08 NOTE — Progress Notes (Signed)
 [facility] [phone]   [phone]  PROGRESS NOTE  Patient Care Team: [name], MD as PCP - General (Internal Medicine) Okey Vina GAILS, MD as PCP - Cardiology (Cardiology) Tat, Asberry RAMAN, DO as Consulting Physician (Neurology)  Hematological/Oncological History # IgA Kappa Monoclonal Gammopathy of Undetermined Significance.  06/26/2021: SPEP detected M protein measuring 0.5 g/dL.  Immunofixation shows a poorly-defined area of restricted protein mobility is detected and is reactive with IgA and kappa.   07/29/2021: Establish care with [employer] PA-C and Dr. Federico  08/12/2021: bone marrow biopsy shows 5% plasma cells.   Interval History:  Michael Spencer 78 y.o. male with medical history significant for IgA kappa MGUS who presents for a follow up visit. The patient's last visit was on 05/20/2022. In the interim since the last visit he has had no major changes in his health.   On exam today Michael Spencer is accompanied by his wife.  He reports he has been well overall in the room since her last visit with no hospitalizations, new medications, or emergency room visits.  He reports he has not had any issues with infectious symptoms such as runny nose, sore throat, or cough.  He denies any new bone or back pain.  He is also had no changes in his urine.  He reports his energy levels today are about a 9 out of 10 and his appetite remains strong.  He reports he likes to donate platelets and is surprised his platelet level count is high.  We discussed how platelets regenerate.. Overall he is at his baseline level of health with no recent changes.  He otherwise denies any fevers, chills, sweats, nausea, vomiting or diarrhea.  Full 10 point ROS is listed below.  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   HTN (hypertension) 07/09/2017   Seasonal allergies 07/09/2017   Tinnitus 07/09/2017   Varicose vein of leg 07/09/2017    SURGICAL HISTORY: Past Surgical History:  Procedure Laterality Date   CORONARY ARTERY BYPASS GRAFT     HERNIA REPAIR Right 2005   NOSE SURGERY  1981   TRANSURETHRAL RESECTION OF PROSTATE  1988   VARICOSE VEIN SURGERY Left 2014    SOCIAL HISTORY: Social History   Socioeconomic History   Marital status: Married    Spouse name: Not on file   Number of children: Not on file   Years of education: Not on file   Highest education level: Not on file  Occupational History   Occupation: retired    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  Tobacco Use   Smoking status: Former    Types: Cigarettes   Smokeless tobacco: Never  Vaping Use   Vaping status: Never Used  Substance and Sexual Activity   Alcohol use: Not Currently    Comment: rarely   Drug use: No   Sexual activity: Not on file  Other Topics Concern   Not on file  Social History Narrative   Right handed   Lives with wife one story home   Retired    No Caffeine   Social Drivers of Corporate Investment Banker Strain: Not on file  Food Insecurity: Not on file  Transportation Needs: Not on file  Physical Activity: Not on file  Stress: Not on file  Social Connections: Not on file  Intimate Partner Violence: Not on file    FAMILY HISTORY: Family History  Problem Relation Age of Onset   Cancer - Other Mother        LIVER CANCER   Cancer Father        PANCREATIC   Diabetes Father    Hypertension Father    CAD Sister    CAD Sister    CAD Brother    Lymphoma Brother     ALLERGIES:  is allergic to percocet [oxycodone-acetaminophen ].  MEDICATIONS:  Current Outpatient Medications  Medication Sig Dispense Refill   aspirin EC 81 MG tablet Take  81 mg by mouth daily.     atorvastatin  (LIPITOR) 20 MG tablet Take 1 tablet (20 mg total) by mouth daily. 90 tablet 3   Cholecalciferol (SM VITAMIN D3) 100 MCG (4000 UT) CAPS Take 1 capsule (4,000 Units total) by mouth daily in the afternoon. 100 capsule 3   losartan (COZAAR) 25 MG tablet Take 25 mg daily by mouth.  2   nitroGLYCERIN (NITROSTAT) 0.4 MG SL tablet Place 0.4 mg under the tongue every 5 (five) minutes as needed for chest pain.     No current facility-administered medications for this visit.    REVIEW OF SYSTEMS:   Constitutional: ( - ) fevers, ( - )  chills , ( - ) night sweats Eyes: ( - ) blurriness of vision, ( - ) double vision, ( - ) watery eyes Ears, nose, mouth, throat, and face: ( - ) mucositis, ( - ) sore throat Respiratory: ( - ) cough, ( - ) dyspnea, ( - ) wheezes Cardiovascular: ( - ) palpitation, ( - ) chest discomfort, ( - ) lower extremity swelling Gastrointestinal:  ( - ) nausea, ( - ) heartburn, ( - ) change in bowel habits Skin: ( - ) abnormal skin rashes Lymphatics: ( - ) new lymphadenopathy, ( - ) easy bruising Neurological: ( - ) numbness, ( - ) tingling, ( - ) new weaknesses Behavioral/Psych: ( - ) mood change, ( - ) new changes  All other systems were reviewed with the patient and are negative.  PHYSICAL EXAMINATION:  Vitals:   06/08/23 1049  BP: (!) 150/81  Pulse: (!) 57  Resp: 16  Temp: 97.9 F (36.6 C)  SpO2: 99%    Filed Weights   06/08/23 1049  Weight: 196 lb 9.6 oz (89.2 kg)     GENERAL: Well-appearing elderly Caucasian male, alert, no distress and comfortable SKIN: skin color, texture, turgor are normal, no rashes or significant lesions EYES: conjunctiva are pink and non-injected, sclera clear LUNGS: clear to auscultation and percussion with normal breathing effort HEART: regular rate & rhythm and no murmurs and no lower extremity edema Musculoskeletal: no cyanosis of digits and no clubbing  PSYCH: alert & oriented x 3, fluent  speech NEURO: no focal motor/sensory deficits  LABORATORY DATA:  I have reviewed the data as listed    Latest Ref Rng & Units 05/18/2023   10:06 AM 05/13/2022   10:11 AM 11/19/2021    9:44 AM  CBC  WBC 4.0 - 10.5 K/uL 6.1  6.8  6.0   Hemoglobin 13.0 - 17.0 g/dL 84.6  85.5  86.3   Hematocrit 39.0 - 52.0 % 46.1  43.8  41.8   Platelets 150 - 400 K/uL 211  180  214        Latest Ref Rng & Units 05/18/2023   10:06 AM 05/13/2022   10:11 AM 11/19/2021    9:44 AM  CMP  Glucose 70 - 99 mg/dL 894  885  888   BUN 8 - 23 mg/dL 19  14  14    Creatinine 0.61 - 1.24 mg/dL 9.05  9.10  8.94   Sodium 135 - 145 mmol/L 137  137  136   Potassium 3.5 - 5.1 mmol/L 3.8  3.9  4.1   Chloride 98 - 111 mmol/L 103  103  104   CO2 22 - 32 mmol/L 29  29  29    Calcium  8.9 - 10.3 mg/dL 8.9  9.0  9.1   Total Protein 6.5 - 8.1 g/dL 6.9  6.5  7.3   Total Bilirubin <1.2 mg/dL 1.1  0.9  1.0   Alkaline Phos 38 - 126 U/L 71  66  75   AST 15 - 41 U/L 14  12  13    ALT 0 - 44 U/L 14  12  13      Lab Results  Component Value Date   MPROTEIN Not Observed 05/18/2023   MPROTEIN Comment (A) 05/13/2022   MPROTEIN Comment (A) 11/19/2021   Lab Results  Component Value Date   KPAFRELGTCHN 41.0 (H) 05/18/2023   KPAFRELGTCHN 39.0 (H) 05/13/2022   KPAFRELGTCHN 40.1 (H) 11/19/2021   LAMBDASER 31.2 (H) 05/18/2023   LAMBDASER 27.3 (H) 05/13/2022   LAMBDASER 27.6 (H) 11/19/2021   KAPLAMBRATIO 1.31 05/18/2023   KAPLAMBRATIO 1.43 05/13/2022   KAPLAMBRATIO 1.45 11/19/2021    RADIOGRAPHIC STUDIES: No results found.  ASSESSMENT & PLAN Michael Spencer 78 y.o. male with medical history significant for IgA kappa MGUS who presents for a follow up visit.   #IgA Kappa Monoclonal Gammopathy of Undetermined Significance -- At each visit will order an SPEP, SFLC and beta 2 microglobulin --additionally will collect CBC, CMP, and LDH --recommend a metastatic bone survey to assess for lytic lesions at least yearly.  Next due March  2023 --UPEP to be collected yearly, next March 2023 --Bone marrow biopsy performed in March 2023 showed 5% plasma cells.  Findings most consistent with MGUS --Labs today show white blood cell count 6.1, hemoglobin 15.3, MCV 91.8, platelets 211 --IFE shows IgA monoclonal Gammopathy. Unable to quantify M protein. SFLC ratio is WNL.  --Return to clinic in 12 months time.   No orders of the defined types were placed in this encounter.   All questions were answered. The patient knows to call the clinic with any problems, questions or concerns.  A total of more than 30 minutes were spent on this encounter with face-to-face time and non-face-to-face time, including preparing to see the patient, ordering tests and/or medications, counseling the patient and coordination of care as outlined above.   Norleen IVAR Kidney, MD Department of Hematology/Oncology [facility] Phone: [phone] Pager: [phone] Email: [email or web address]  06/16/2023 4:17 PM

## 2023-06-11 NOTE — Progress Notes (Signed)
I saw Demere Capel in neurology clinic on 06/23/23 in follow up for dysarthria and pseudobulbar affect.  HPI: Kaitlyn Mcelhenny is a 78 y.o. year old right-handed male with a medical history of HLD, HTN, CAD s/p CABG, deviated septum s/p surgical correction who we last saw on 08/19/22.  To briefly review: 02/18/22: Patient has had slurred speech for about 1 year. It has slowly progressed since 1 year ago. He also does not have as good of balance as he used to have. Per patient and family, his symptoms fluctuate. He can have days that there is not slurred speech. There is no clear pattern or triggers. His imbalance does not fluctuate. He sometimes has difficulty getting words out when talking. He will know the word but cannot say it.   Muscle bulk loss? Not clearly losing muscle He has cramping in his calves. He does not have significant twitching. He has some sensory changes in bilateral feet. He denies significant pain. Suggestion of myotonia/difficulty relaxing after contraction? No  Fatigable weakness? No Does strength improve after brief exercise? No  Able to brush hair/teeth without difficulty? Yes  Able to button shirts/use zips? Yes  Clumsiness/dropping grasped objects? No Can you arise from squatted position easily? Yes  Able to get out of chair without using arms? Yes  Able to walk up steps easily? Yes Use an assistive device to walk? No  Significant imbalance with walking? Yes  Falls? No   Any change in urine color, especially after exertion/physical activity? No   He endorses occasional double vision that will resolve with covering one eye (once every 2 weeks). He can squint and have the symptoms resolve. He denies ptosis.   He denies difficulty chewing. He endorses occasional choking when drinking water in the morning. Wife thinks patient is coughing or choking more often.   There are no neuromuscular respiratory weakness symptoms, particularly orthopnea>dyspnea.     Pseudobulbar affect is present. This was present prior to the slurred speech, perhaps 1-2 years in duration.   The patient does not report symptoms referable to autonomic dysfunction including impaired sweating, excessive mucosal dryness, gastroparetic early satiety, postprandial abdominal bloating, constipation, bowel or bladder dyscontrol, or syncope/presyncope/orthostatic intolerance.   He endorses cold intolerance. He gets cold quicker than wife.   The patient has not noticed any recent skin rashes nor does he report any constitutional symptoms like fever, night sweats. He has lost 20 lbs over the last year. Per wife, they had a new dog over the same period in which the patient is walking the dog 3 times per day. They think this is the reason for the weight loss.   EtOH use: Rare  Restrictive diet? No Family history of neuropathy/myopathy/NM disease? Brother with Parkinson's disease. Father had cerebellar degeneration (he would fall when walking, no voice changes; he was an alcoholic)   Patient was previously seen in this office by Dr. Arbutus Leas, initially on 05/20/2021. History per that clinic note: "Jaremy Hartline was seen today in the movement disorders clinic for neurologic consultation at the request of Georgann Housekeeper, MD.  The consultation is for the evaluation of balance difficulty, gait change and to rule out Parkinson's disease.  Medical records made available to me are reviewed.  Patient actually feels that that gait and balance change have been getting better. Patient feels that biggest issue is that he was having intermittent speech change.  Patient states that he would have episodes where he knew what he would like to say but  could not get the proper words out.  Each episode would last between 1 and 5 minutes per medical records made available to me..   Today, patient and family state that sx's started 3 months ago ago.  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  That has actually gotten better and it is not there any longer per pt but daughter isn't convinced it is gone.  No falls.  About 1.5 months ago, he noted slow onset of speech change.   He states that speech change comes and goes.  It lasts a max of 5 min per wife.  Speech change consists of slurred speech.  He denies trouble with word finding but daughter states that he has had episodes of word finding trouble.  He doesn't know what would bring on an episodes.   Specific Symptoms:  Tremor: No. Family hx of similar:  Yes.   brother with Parkinsons Disease ; father with "degeneration of the cerebellum" (? SCA) Voice: weak/soft Sleep: sleeps well             Vivid Dreams:  No.             Acting out dreams:  No. Wet Pillows: No. Postural symptoms:  Yes.               Falls?  No. Bradykinesia symptoms: difficulty getting out of a chair; no shuffling; no drooling Loss of smell:  No. Loss of taste:  No. Urinary Incontinence:  No. Difficulty Swallowing:  No. Handwriting, micrographia: No. Trouble with ADL's:  No.             Trouble buttoning clothing: No. Depression:  pt states normal; wife states little depressed Memory changes:  some short term changes Hallucinations:  No.             visual distortions: No. N/V:  No. Lightheaded:  No.             Syncope: No. Diplopia:  none now - had in remote past - perhaps over year ago Dyskinesia:  Yes.     MRI brain was completed on May 09, 2021.  There were chronic lacunar infarctions in the right cerebellum, left corona radiata and left lentiform nucleus."   Work up has included AChR ab (neg), MRI brain w/wo with small vessel disease and lacunar infarcts in left corona radiata, lentiform nucleus, and right cerebellum. EMG was completed on 06/24/21 that showed chronic right C7 radiculopathy without more diffuse process. Repeat EMG on 01/01/22 was similar without evidence of motor neuron disease. M protein was  found on immunofixation. Patient saw oncology who did a bone marrow biopsy and diagnosed patient with MGUS. He was also put on B12 supplementation for deficiency.  08/19/22: Patient had MBS on 02/24/22. There was no oral or oropharyngeal dysphagia seen. Patient will cough when drinking cold water first thing in the morning, but denies difficulty swallowing otherwise.   AChR abs were negative.   Patient went to speech therapy. He learned skills to help him be better understood.   Nuedexta did help patient. Medication was approved by insurance through 2023. It was $650/month, so patient did not fill the prescription. He was not that bothered by symptoms.   Patient denies any weakness. He denies changes to cognition.   Patient continues to follow with Dr. Leonides Schanz for IgA Kappa MGUS.  Most recent Assessment and Plan (08/19/22): This is [employer], a 78  y.o. male with dysarthria and pseudobulbar affect. B12 was borderline previously. EMG x2 showed no evidence of MND. AChR abs for MG were negative. MRI brain showed lacunar infarcts in left corona radiata and lentiform nucleus and right cerebellum. They etiology of symptoms is currently unclear. There is no current evidence of motor neuron disease or parkinsonism.    Plan: -Will continue to closely monitor symptoms for progression -May consider repeating EMG if new weakness develops -Continue vit D and B12 supplementation -Patient instructed to call if having new or worsening symptoms -Follow up in 6 months  Since their last visit: Patient is doing about the same as prior. He continues to have similar dysarthria and pseudobulbar affect. He denies any changes in swallowing, freezing, falls, cognitive complaints. His weight is about the same (196 lbs today vs 200 lbs on 08/19/22). He does have occasional imbalance.  He continues to see Dr. Leonides Schanz for IgA Kappa MGUS.   MEDICATIONS:  Outpatient Encounter Medications as of 06/23/2023  Medication  Sig   aspirin EC 81 MG tablet Take 81 mg by mouth daily.   atorvastatin (LIPITOR) 20 MG tablet Take 1 tablet (20 mg total) by mouth daily.   Ferrous Sulfate (IRON PO) Take by mouth.   losartan (COZAAR) 25 MG tablet Take 25 mg daily by mouth.   nitroGLYCERIN (NITROSTAT) 0.4 MG SL tablet Place 0.4 mg under the tongue every 5 (five) minutes as needed for chest pain.   [DISCONTINUED] Cholecalciferol (SM VITAMIN D3) 100 MCG (4000 UT) CAPS Take 1 capsule (4,000 Units total) by mouth daily in the afternoon.   No facility-administered encounter medications on file as of 06/23/2023.    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   HTN (hypertension) 07/09/2017   Seasonal allergies 07/09/2017   Tinnitus 07/09/2017   Varicose vein of leg 07/09/2017    PAST SURGICAL HISTORY: Past Surgical History:  Procedure Laterality Date   CORONARY ARTERY BYPASS GRAFT     HERNIA REPAIR Right 2005   NOSE SURGERY  1981   TRANSURETHRAL RESECTION OF PROSTATE  1988   VARICOSE VEIN SURGERY Left 2014    ALLERGIES: Allergies  Allergen Reactions   Percocet [Oxycodone-Acetaminophen] Other (See Comments)    hallucinations    FAMILY HISTORY: Family History  Problem Relation Age of Onset   Cancer - Other Mother        LIVER CANCER   Cancer Father        PANCREATIC   Diabetes Father    Hypertension Father    CAD Sister    CAD Sister    CAD Brother    Lymphoma Brother     SOCIAL HISTORY: Social History   Tobacco Use   Smoking status: Former    Types: Cigarettes   Smokeless tobacco: Never  Vaping Use   Vaping status: Never Used  Substance Use Topics   Alcohol use: Not Currently    Comment: rarely    Drug use: No   Social History   Social History Narrative   Right handed   Lives with wife one story home   Retired    No Caffeine    Objective:  Vital Signs:  BP (!) 149/86   Pulse 70   Ht 6\' 1"  (1.854 m)   Wt 200 lb 6.4 oz (90.9 kg)   SpO2 98%   BMI 26.44 kg/m   General: General appearance: Awake and alert. No distress. Cooperative with exam.  Skin: No obvious rash or jaundice. HEENT: Atraumatic. Anicteric. Lungs: Non-labored breathing on room air  Extremities: No edema.  Neurological: Mental Status: Alert. Speech fluent. No pseudobulbar affect Cranial Nerves: CNII: No RAPD. Visual fields intact. CNIII, IV, VI: PERRL. No nystagmus. EOMI. Choppy pursuit. CN V: Facial sensation intact bilaterally to fine touch. Masseter clench strong. Jaw jerk negative. CN VII: Facial muscles symmetric and strong. No ptosis at rest. CN VIII: Hears finger rub well bilaterally. CN IX: No hypophonia. CN X: Palate elevates symmetrically. CN XI: Full strength shoulder shrug bilaterally. CN XII: Tongue protrusion full and midline. No atrophy or fasciculations. Moderate dysarthria, similar to prior Motor: Tone is normal. No fasciculations in extremities. No obvious atrophy.  Individual muscle group testing (MRC grade out of 5):  Movement     Neck flexion 5    Neck extension 5     Right Left   Shoulder abduction 5 5   Shoulder adduction 5 5   Elbow flexion 5 5   Elbow extension 5 5   Finger abduction - FDI 5 5   Finger abduction - ADM 5 5   Finger extension 5 5   Finger distal flexion - 2/3 5 5    Finger distal flexion - 4/5 5 5    Thumb flexion - FPL 5 5   Thumb abduction - APB 5- 5-    Hip flexion 5 5   Knee extension 5 5   Knee flexion 5 5   Dorsiflexion 5 5   Plantarflexion 5 5    Reflexes:  Right Left  Bicep 2+ 2+  Tricep 2+ 2+  BrRad 2+ 2+  Knee 2+ 2+  Ankle 1+ 1+   Pathological Reflexes: Babinski: flexor response bilaterally Hoffman: absent  bilaterally Troemner: absent bilaterally Sensation: Pinprick: Intact in all extremities Vibration: Intact in all extremities Coordination: Intact finger-to- nose-finger bilaterally. Romberg negative. Mild finger tapping incoordination. Gait: Able to rise from chair with arms crossed unassisted. Normal, narrow-based gait. Mild instability with turns. No en bloc turns or freezing.   Lab and Test Review: New results: 05/18/23: MM panel: IgA elevated to 615 (similar to prior); M protein not observed K/L light chain ratio wnl CBC w/ diff unremarkable CMP unremarkable  Vit D (11/04/22) wnl  Previously reviewed results: 07/03/22: Vit D: 18.4 HbA1c: 5.8   Normal or unremarkable: lyme, heavy metals, PTH, copper, TSH, LDH MG panel (05/20/21): negative IFE and SPEP (06/26/21): poorly defined region of restricted mobility (IgA kappa) B12 (06/26/21): 231; repeat on 07/29/21: 582 with normal MMA     MRI cervical spine wo contrast (07/14/21): FINDINGS: Alignment: Straightening and slight reversal of the normal cervical lordosis. Trace anterolisthesis at C2-C3.   Vertebrae: No fracture, evidence of discitis, or bone lesion. Small hemangioma at base of the dens.   Cord: Normal signal and morphology.   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 Otherwise negative.   Disc levels:   C2-C3: Negative disc. Moderate right and mild left facet arthropathy. No stenosis.   C3-C4: Tiny shallow broad-based posterior disc protrusion slightly eccentric to the right. Mild right uncovertebral hypertrophy. Moderate bilateral facet arthropathy. Mild right neuroforaminal stenosis. No spinal canal or left  neuroforaminal stenosis.   C4-C5: Negative disc. Moderate right and mild left facet arthropathy. No stenosis.   C5-C6: Small posterior disc osteophyte complex eccentric to the left. Left greater than right facet  uncovertebral hypertrophy. Slight flattening of the left ventral cord with borderline mild spinal canal stenosis. Moderate to severe left neuroforaminal stenosis. No right neuroforaminal stenosis.   C6-C7: Small posterior disc osteophyte complex and mild bilateral facet uncovertebral hypertrophy. Borderline mild spinal canal stenosis. No neuroforaminal stenosis.   C7-T1: Negative disc. Moderate bilateral facet arthropathy. No stenosis.   IMPRESSION: 1. Multilevel degenerative changes of the cervical spine as described above. Moderate to severe left neuroforaminal stenosis at C5-C6. No significant spinal canal stenosis at any level.   MRI brain w/wo contrast (05/09/21): FINDINGS: Brain: Cerebral volume is within normal limits for age. No restricted diffusion to suggest acute infarction. No midline shift, mass effect, evidence of mass lesion, ventriculomegaly, extra-axial collection or acute intracranial hemorrhage. Cervicomedullary junction and pituitary are within normal limits.   There is a small chronic lacunar infarct tracking from the left corona radiata to the left lentiform. This is facilitated on diffusion. And there is a small chronic infarct in the posterior cerebellum on the right (series 13, image 6). But elsewhere generally normal for age gray and white matter signal throughout the brain. No cortical encephalomalacia or chronic cerebral blood products identified. There is minimal T2 heterogeneity in the right pons.   No abnormal enhancement identified (minimal central pontine capillary telangiectasia, normal variant). No dural thickening.   Vascular: Major intracranial vascular flow voids are preserved. The distal left vertebral artery appears to be dominant. The major dural venous sinuses are enhancing and appear to be patent.   Skull and upper cervical spine: Negative for age visible cervical spine. Visualized bone marrow signal is within normal limits.    Sinuses/Orbits: Negative orbits. Only trace paranasal sinus mucosal thickening.   Other: Mastoid air cells are clear. Visible internal auditory structures appear normal. Negative visible scalp and face.   IMPRESSION: 1. No acute or subacute intracranial abnormality identified. 2. There are small chronic small vessel/lacunar type infarcts in the left corona radiata and lentiform, and right cerebellum.   EMG 06/24/21: Impression: Chronic C7 radiculopathy affecting the right upper extremity, mild. In isolation, mild chronic neurogenic changes involving the hyoglossus muscle are of unclear clinical significance.  Repeat electrodiagnostic testing in 6 months, if clinically indicated. There is no evidence of sensorimotor neuropathy, wide spread disorder of anterior horn cells, or diffuse myopathy.   EMG 01/01/22: Impression: As compared to prior study on 06/24/2021, there has been no change significant change.  Findings show: Chronic C7 radiculopathy affecting the right upper extremity, mild and unchanged. In isolation, mild chronic neurogenic changes involving the hyoglossus muscle is of unclear clinical significance.  Right ulnar neuropathy with slowing across the elbow, demyelinating, mild, is new.  There is no evidence of sensorimotor neuropathy, wide spread disorder of anterior horn cells, or diffuse myopathy.  MBS (02/24/22): Pt demonstrates no oral or oropharyngeal dysphagia. No penetration, aspiration or residual. Pts speech was slurred throughout assessment. Geographic tongue and slight left lingual deviation noted. Pt safe to continue a regular diet and thin liquids. Encouraged appt with SLP for dysarthria interventions. No f/u needed for swallowing at this time.   ASSESSMENT: This is [employer], a 78 y.o. male with dysarthria and pseudobulbar affect. B12 was borderline previously. EMG x2 showed no evidence of MND. AChR abs for MG were negative. MRI brain showed lacunar infarcts  in  left corona radiata and lentiform nucleus and right cerebellum. They etiology of symptoms is currently unclear. There is no current evidence of motor neuron disease or parkinsonism.   Plan: -Will continue to closely monitor symptoms for progression -May consider repeating EMG if new weakness develops -Patient instructed to call if having new or worsening symptoms   Return to clinic in 1 year  Total time spent reviewing records, interview, history/exam, documentation, and coordination of care on day of encounter:  35 min  Jacquelyne Balint, MD

## 2023-06-16 DIAGNOSIS — K080 Exfoliation of teeth due to systemic causes: Secondary | ICD-10-CM | POA: Diagnosis not present

## 2023-06-21 NOTE — Progress Notes (Unsigned)
Cardiology Office Note   Date:  06/22/2023   ID:  Gregg Mccathern, DOB 1946/04/21, MRN [id number]  PCP:  Georgann Housekeeper, MD  Cardiologist:   Dietrich Pates, MD   Pt presents for f/u of CAD     History of Present Illness: Michael Spencer is a 78 y.o. male with a history of CAD (s/p CABG in 2012, [place]), HTN, HL, varicose veins.  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 His smoking use included cigarettes. He has never used smokeless tobacco. He reports that he does not currently use alcohol. He reports that he does not use drugs.   Family History:  The patient's family history includes CAD in his brother, sister, and sister; Cancer in his father; Cancer - Other in his mother; Diabetes in his father; Hypertension in his father; Lymphoma in his brother.    ROS:  Please see the history of present illness. All other systems are reviewed and  Negative to the above problem except as noted.    PHYSICAL EXAM: VS:  BP 124/60   Pulse (!) 56   Ht 6\' 1"  (1.854 m)   Wt 196 lb  (88.9 kg)   SpO2 98%   BMI 25.86 kg/m    GEN: Well nourished, well developed, in NAD HEENT: normal  Neck: JVP is not elevated  No bruit Cardiac: RRR; no murmurs No  LE edema  Respiratory:  clear to auscultation GI: soft, nontender,No hepatomegaly    MS: no deformity Moving all extremities     EKG:  EKG shows SB 56 bpm   First degree AV block  PR 222 msec  Incomp RBBB   Lipid Panel No results found for: "CHOL", "TRIG", "HDL", "CHOLHDL", "VLDL", "LDLCALC", "LDLDIRECT"    Wt Readings from Last 3 Encounters:  06/22/23 196 lb (88.9 kg)  06/08/23 196 lb 9.6 oz (89.2 kg)  08/19/22 200 lb (90.7 kg)      ASSESSMENT AND PLAN:  1  CAD    CABG in 2012  Myovue done when he lived in [place]  in 2018   No ischemia    Active  Symptom free   Follow   2  HTN  BP is contrllled  Follow   Keep on low dose losartan  3  HL    LDL 58  HDL 49  Trig 80  Keep on lipitor  4  CV dz  Mild plaquing in 2019   F/U in 1 year       Current medicines are reviewed at length with the patient today.  The patient does not have concerns regarding medicines.  Signed, Dietrich Pates, MD  06/22/2023 11:32 AM    [facility] [address] [place], [place], [place]  [address] Phone: [phone]; Fax: [phone]

## 2023-06-22 ENCOUNTER — Encounter: Payer: Self-pay | Admitting: Internal Medicine

## 2023-06-22 ENCOUNTER — Ambulatory Visit: Payer: Medicare Other | Attending: Internal Medicine | Admitting: Internal Medicine

## 2023-06-22 VITALS — BP 124/60 | HR 56 | Ht 73.0 in | Wt 196.0 lb

## 2023-06-22 DIAGNOSIS — I2510 Atherosclerotic heart disease of native coronary artery without angina pectoris: Secondary | ICD-10-CM | POA: Diagnosis not present

## 2023-06-22 NOTE — Patient Instructions (Signed)
Medication Instructions:  Your physician recommends that you continue on your current medications as directed. Please refer to the Current Medication list given to you today.  *If you need a refill on your cardiac medications before your next appointment, please call your pharmacy*  Lab Work: None ordered today. If you have labs (blood work) drawn today and your tests are completely normal, you will receive your results only by: MyChart Message (if you have MyChart) OR A paper copy in the mail If you have any lab test that is abnormal or we need to change your treatment, we will call you to review the results.  Testing/Procedures: None ordered today.  Follow-Up: At [facility], you and your health needs are our priority.  As part of our continuing mission to provide you with exceptional heart care, we have created designated Provider Care Teams.  These Care Teams include your primary Cardiologist (physician) and Advanced Practice Providers (APPs -  Physician Assistants and Nurse Practitioners) who all work together to provide you with the care you need, when you need it.  We recommend signing up for the patient portal called "MyChart".  Sign up information is provided on this After Visit Summary.  MyChart is used to connect with patients for Virtual Visits (Telemedicine).  Patients are able to view lab/test results, encounter notes, upcoming appointments, etc.  Non-urgent messages can be sent to your provider as well.   To learn more about what you can do with MyChart, go to [email or web address].    Your next appointment:   1 year(s)  The format for your next appointment:   In Person  Provider:   Dietrich Pates, MD {

## 2023-06-23 ENCOUNTER — Encounter: Payer: Self-pay | Admitting: Neurology

## 2023-06-23 ENCOUNTER — Ambulatory Visit: Payer: Medicare Other | Admitting: Neurology

## 2023-06-23 VITALS — BP 149/86 | HR 70 | Ht 73.0 in | Wt 200.4 lb

## 2023-06-23 DIAGNOSIS — F482 Pseudobulbar affect: Secondary | ICD-10-CM | POA: Diagnosis not present

## 2023-06-23 DIAGNOSIS — E538 Deficiency of other specified B group vitamins: Secondary | ICD-10-CM | POA: Diagnosis not present

## 2023-06-23 DIAGNOSIS — D472 Monoclonal gammopathy: Secondary | ICD-10-CM

## 2023-06-23 DIAGNOSIS — R471 Dysarthria and anarthria: Secondary | ICD-10-CM

## 2023-06-23 NOTE — Patient Instructions (Addendum)
I still see no evidence of a larger process such as parkinsonism or ALS that is causing your symptoms. This is good, but please let me know if you are having any changes to speech, swallowing, breathing, or weakness of arms or legs.  I will continue to monitor with yearly visits. Please let me know if you have any questions or concerns in the meantime.  The physicians and staff at [facility] are committed to providing excellent care. You may receive a survey requesting feedback about your experience at our office. We strive to receive "very good" responses to the survey questions. If you feel that your experience would prevent you from giving the office a "very good " response, please contact our office to try to remedy the situation. We may be reached at [phone]. Thank you for taking the time out of your busy day to complete the survey.  Jacquelyne Balint, MD [facility]

## 2023-06-27 ENCOUNTER — Other Ambulatory Visit: Payer: Self-pay | Admitting: Internal Medicine

## 2023-08-03 DIAGNOSIS — I25810 Atherosclerosis of coronary artery bypass graft(s) without angina pectoris: Secondary | ICD-10-CM | POA: Diagnosis not present

## 2023-08-03 DIAGNOSIS — Z0000 Encounter for general adult medical examination without abnormal findings: Secondary | ICD-10-CM | POA: Diagnosis not present

## 2023-08-03 DIAGNOSIS — N400 Enlarged prostate without lower urinary tract symptoms: Secondary | ICD-10-CM | POA: Diagnosis not present

## 2023-08-03 DIAGNOSIS — D472 Monoclonal gammopathy: Secondary | ICD-10-CM | POA: Diagnosis not present

## 2023-08-03 DIAGNOSIS — I10 Essential (primary) hypertension: Secondary | ICD-10-CM | POA: Diagnosis not present

## 2023-08-03 DIAGNOSIS — Z23 Encounter for immunization: Secondary | ICD-10-CM | POA: Diagnosis not present

## 2023-08-03 DIAGNOSIS — Z1331 Encounter for screening for depression: Secondary | ICD-10-CM | POA: Diagnosis not present

## 2023-08-04 DIAGNOSIS — I25810 Atherosclerosis of coronary artery bypass graft(s) without angina pectoris: Secondary | ICD-10-CM | POA: Diagnosis not present

## 2023-08-04 DIAGNOSIS — N400 Enlarged prostate without lower urinary tract symptoms: Secondary | ICD-10-CM | POA: Diagnosis not present

## 2023-08-04 DIAGNOSIS — E538 Deficiency of other specified B group vitamins: Secondary | ICD-10-CM | POA: Diagnosis not present

## 2023-08-04 DIAGNOSIS — E559 Vitamin D deficiency, unspecified: Secondary | ICD-10-CM | POA: Diagnosis not present

## 2023-08-04 DIAGNOSIS — I10 Essential (primary) hypertension: Secondary | ICD-10-CM | POA: Diagnosis not present

## 2023-08-04 DIAGNOSIS — R7303 Prediabetes: Secondary | ICD-10-CM | POA: Diagnosis not present

## 2023-10-05 DIAGNOSIS — R972 Elevated prostate specific antigen [PSA]: Secondary | ICD-10-CM | POA: Diagnosis not present

## 2023-12-21 DIAGNOSIS — K080 Exfoliation of teeth due to systemic causes: Secondary | ICD-10-CM | POA: Diagnosis not present

## 2024-02-02 DIAGNOSIS — E7800 Pure hypercholesterolemia, unspecified: Secondary | ICD-10-CM | POA: Diagnosis not present

## 2024-02-02 DIAGNOSIS — I25810 Atherosclerosis of coronary artery bypass graft(s) without angina pectoris: Secondary | ICD-10-CM | POA: Diagnosis not present

## 2024-02-02 DIAGNOSIS — I10 Essential (primary) hypertension: Secondary | ICD-10-CM | POA: Diagnosis not present

## 2024-02-02 DIAGNOSIS — D472 Monoclonal gammopathy: Secondary | ICD-10-CM | POA: Diagnosis not present

## 2024-02-04 DIAGNOSIS — N200 Calculus of kidney: Secondary | ICD-10-CM | POA: Diagnosis not present

## 2024-02-04 DIAGNOSIS — R319 Hematuria, unspecified: Secondary | ICD-10-CM | POA: Diagnosis not present

## 2024-03-27 DIAGNOSIS — L570 Actinic keratosis: Secondary | ICD-10-CM | POA: Diagnosis not present

## 2024-03-27 DIAGNOSIS — L821 Other seborrheic keratosis: Secondary | ICD-10-CM | POA: Diagnosis not present

## 2024-04-25 ENCOUNTER — Encounter: Payer: Self-pay | Admitting: Internal Medicine

## 2024-06-05 ENCOUNTER — Other Ambulatory Visit: Payer: Self-pay | Admitting: Hematology and Oncology

## 2024-06-05 DIAGNOSIS — D472 Monoclonal gammopathy: Secondary | ICD-10-CM

## 2024-06-05 DIAGNOSIS — E538 Deficiency of other specified B group vitamins: Secondary | ICD-10-CM

## 2024-06-06 ENCOUNTER — Inpatient Hospital Stay: Payer: Medicare Other | Attending: Physician Assistant

## 2024-06-06 DIAGNOSIS — E538 Deficiency of other specified B group vitamins: Secondary | ICD-10-CM

## 2024-06-06 DIAGNOSIS — D472 Monoclonal gammopathy: Secondary | ICD-10-CM

## 2024-06-06 LAB — CMP (CANCER CENTER ONLY)
ALT: 14 U/L (ref 0–44)
AST: 16 U/L (ref 15–41)
Albumin: 4.2 g/dL (ref 3.5–5.0)
Alkaline Phosphatase: 80 U/L (ref 38–126)
Anion gap: 8 (ref 5–15)
BUN: 11 mg/dL (ref 8–23)
CO2: 29 mmol/L (ref 22–32)
Calcium: 9.3 mg/dL (ref 8.9–10.3)
Chloride: 100 mmol/L (ref 98–111)
Creatinine: 1.09 mg/dL (ref 0.61–1.24)
GFR, Estimated: >60 mL/min
Glucose, Bld: 128 mg/dL — ABNORMAL HIGH (ref 70–99)
Potassium: 4.1 mmol/L (ref 3.5–5.1)
Sodium: 137 mmol/L (ref 135–145)
Total Bilirubin: 0.9 mg/dL (ref 0.0–1.2)
Total Protein: 7.7 g/dL (ref 6.5–8.1)

## 2024-06-06 LAB — CBC WITH DIFFERENTIAL (CANCER CENTER ONLY)
Abs Immature Granulocytes: 0.02 K/uL (ref 0.00–0.07)
Basophils Absolute: 0 K/uL (ref 0.0–0.1)
Basophils Relative: 1 %
Eosinophils Absolute: 0.1 K/uL (ref 0.0–0.5)
Eosinophils Relative: 1 %
HCT: 47.9 % (ref 39.0–52.0)
Hemoglobin: 16.1 g/dL (ref 13.0–17.0)
Immature Granulocytes: 0 %
Lymphocytes Relative: 17 %
Lymphs Abs: 1.1 K/uL (ref 0.7–4.0)
MCH: 31.4 pg (ref 26.0–34.0)
MCHC: 33.6 g/dL (ref 30.0–36.0)
MCV: 93.6 fL (ref 80.0–100.0)
Monocytes Absolute: 0.5 K/uL (ref 0.1–1.0)
Monocytes Relative: 8 %
Neutro Abs: 4.8 K/uL (ref 1.7–7.7)
Neutrophils Relative %: 73 %
Platelet Count: 222 K/uL (ref 150–400)
RBC: 5.12 MIL/uL (ref 4.22–5.81)
RDW: 12.4 % (ref 11.5–15.5)
WBC Count: 6.5 K/uL (ref 4.0–10.5)
nRBC: 0 % (ref 0.0–0.2)

## 2024-06-06 LAB — VITAMIN B12: Vitamin B-12: 2479 pg/mL — ABNORMAL HIGH (ref 180–914)

## 2024-06-06 LAB — LACTATE DEHYDROGENASE: LDH: 189 U/L (ref 105–235)

## 2024-06-08 LAB — KAPPA/LAMBDA LIGHT CHAINS
Kappa free light chain: 56.5 mg/L — ABNORMAL HIGH (ref 3.3–19.4)
Kappa, lambda light chain ratio: 1.78 — ABNORMAL HIGH (ref 0.26–1.65)
Lambda free light chains: 31.7 mg/L — ABNORMAL HIGH (ref 5.7–26.3)

## 2024-06-08 LAB — METHYLMALONIC ACID, SERUM: Methylmalonic Acid, Quantitative: 131 nmol/L (ref 0–378)

## 2024-06-09 ENCOUNTER — Telehealth: Payer: Self-pay | Admitting: Physician Assistant

## 2024-06-09 NOTE — Telephone Encounter (Signed)
 I spoke with patient as he called in to reschedule 06/13/2024 appointment to 06/21/2024 due to traveling.

## 2024-06-10 LAB — MULTIPLE MYELOMA PANEL, SERUM
Albumin SerPl Elph-Mcnc: 3.7 g/dL (ref 2.9–4.4)
Albumin/Glob SerPl: 1.1 (ref 0.7–1.7)
Alpha 1: 0.2 g/dL (ref 0.0–0.4)
Alpha2 Glob SerPl Elph-Mcnc: 0.6 g/dL (ref 0.4–1.0)
B-Globulin SerPl Elph-Mcnc: 1.3 g/dL (ref 0.7–1.3)
Gamma Glob SerPl Elph-Mcnc: 1.3 g/dL (ref 0.4–1.8)
Globulin, Total: 3.5 g/dL (ref 2.2–3.9)
IgA: 661 mg/dL — ABNORMAL HIGH (ref 61–437)
IgG (Immunoglobin G), Serum: 1460 mg/dL (ref 603–1613)
IgM (Immunoglobulin M), Srm: 76 mg/dL (ref 15–143)
Total Protein ELP: 7.2 g/dL (ref 6.0–8.5)

## 2024-06-13 ENCOUNTER — Inpatient Hospital Stay: Payer: Medicare Other | Admitting: Physician Assistant

## 2024-06-19 NOTE — Progress Notes (Signed)
 "  I saw Michael Spencer in neurology clinic on 06/28/24 in follow up for dysarthria and pseudobulbar affect.  HPI: Michael Spencer is a 79 y.o. year old  right-handed male with a medical history of HLD, HTN, CAD s/p CABG, deviated septum s/p surgical correction who we last saw on 06/23/23.  To briefly review: 02/18/22: Patient has had slurred speech for about 1 year. It has slowly progressed since 1 year ago. He also does not have as good of balance as he used to have. Per patient and family, his symptoms fluctuate. He can have days that there is not slurred speech. There is no clear pattern or triggers. His imbalance does not fluctuate. He sometimes has difficulty getting words out when talking. He will know the word but cannot say it.   Muscle bulk loss? Not clearly losing muscle He has cramping in his calves. He does not have significant twitching. He has some sensory changes in bilateral feet. He denies significant pain. Suggestion of myotonia/difficulty relaxing after contraction? No  Fatigable weakness? No Does strength improve after brief exercise? No  Able to brush hair/teeth without difficulty? Yes  Able to button shirts/use zips? Yes  Clumsiness/dropping grasped objects? No Can you arise from squatted position easily? Yes  Able to get out of chair without using arms? Yes  Able to walk up steps easily? Yes Use an assistive device to walk? No  Significant imbalance with walking? Yes  Falls? No   Any change in urine color, especially after exertion/physical activity? No   He endorses occasional double vision that will resolve with covering one eye (once every 2 weeks). He can squint and have the symptoms resolve. He denies ptosis.   He denies difficulty chewing. He endorses occasional choking when drinking water in the morning. Wife thinks patient is coughing or choking more often.   There are no neuromuscular respiratory weakness symptoms, particularly orthopnea>dyspnea.     Pseudobulbar affect is present. This was present prior to the slurred speech, perhaps 1-2 years in duration.   The patient does not report symptoms referable to autonomic dysfunction including impaired sweating, excessive mucosal dryness, gastroparetic early satiety, postprandial abdominal bloating, constipation, bowel or bladder dyscontrol, or syncope/presyncope/orthostatic intolerance.   He endorses cold intolerance. He gets cold quicker than wife.   The patient has not noticed any recent skin rashes nor does he report any constitutional symptoms like fever, night sweats. He has lost 20 lbs over the last year. Per wife, they had a new dog over the same period in which the patient is walking the dog 3 times per day. They think this is the reason for the weight loss.   EtOH use: Rare  Restrictive diet? No Family history of neuropathy/myopathy/NM disease? Brother with Parkinson's disease. Father had cerebellar degeneration (he would fall when walking, no voice changes; he was an alcoholic)   Patient was previously seen in this office by Dr. [name], initially on 05/20/2021. History per that clinic note: Michael Spencer was seen today in the movement disorders clinic for neurologic consultation at the request of Michael Spencer, [name], MD.  The consultation is for the evaluation of balance difficulty, gait change and to rule out Parkinson's disease.  Medical records made available to me are reviewed.  Patient actually feels that that gait and balance change have been getting better. Patient feels that biggest issue is that he was having intermittent speech change.  Patient states that he would have episodes where he knew what he would like to  say but could not get the proper words out.  Each episode would last between 1 and 5 minutes per medical records made available to me..   Today, patient and family state that sx's started 3 months ago ago.  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  That has actually gotten better and it is not there any longer per pt but daughter isn't convinced it is gone.  No falls.  About 1.5 months ago, he noted slow onset of speech change.   He states that speech change comes and goes.  It lasts a max of 5 min per wife.  Speech change consists of slurred speech.  He denies trouble with word finding but daughter states that he has had episodes of word finding trouble.  He doesn't know what would bring on an episodes.   Specific Symptoms:  Tremor: No. Family hx of similar:  Yes.   brother with Parkinsons Disease ; father with degeneration of the cerebellum (? SCA) Voice: weak/soft Sleep: sleeps well             Vivid Dreams:  No.             Acting out dreams:  No. Wet Pillows: No. Postural symptoms:  Yes.               Falls?  No. Bradykinesia symptoms: difficulty getting out of a chair; no shuffling; no drooling Loss of smell:  No. Loss of taste:  No. Urinary Incontinence:  No. Difficulty Swallowing:  No. Handwriting, micrographia: No. Trouble with ADL's:  No.             Trouble buttoning clothing: No. Depression:  pt states normal; wife states little depressed Memory changes:  some short term changes Hallucinations:  No.             visual distortions: No. N/V:  No. Lightheaded:  No.             Syncope: No. Diplopia:  none now - had in remote past - perhaps over year ago Dyskinesia:  Yes.     MRI brain was completed on May 09, 2021.  There were chronic lacunar infarctions in the right cerebellum, left corona radiata and left lentiform nucleus.   Work up has included AChR ab (neg), MRI brain w/wo with small vessel disease and lacunar infarcts in left corona radiata, lentiform nucleus, and right cerebellum. EMG was completed on 06/24/21 that showed chronic right C7 radiculopathy without more diffuse process. Repeat EMG on 01/01/22 was similar without evidence of motor neuron disease. M protein was  found on immunofixation. Patient saw oncology who did a bone marrow biopsy and diagnosed patient with MGUS. He was also put on B12 supplementation for deficiency.   08/19/22: Patient had MBS on 02/24/22. There was no oral or oropharyngeal dysphagia seen. Patient will cough when drinking cold water first thing in the morning, but denies difficulty swallowing otherwise.   AChR abs were negative.   Patient went to speech therapy. He learned skills to help him be better understood.   Nuedexta  did help patient. Medication was approved by insurance through 2023. It was $650/month, so patient did not fill the prescription. He was not that bothered by symptoms.   Patient denies any weakness. He denies changes to cognition.   Patient continues to follow with Dr. Federico for IgA Kappa MGUS.  Most recent Assessment and Plan (06/23/23): This is Michael Spencer, a 79 y.o. male with dysarthria and pseudobulbar affect. B12 was borderline previously. EMG x2 showed no evidence of MND. AChR abs for MG were negative. MRI brain showed lacunar infarcts in left corona radiata and lentiform nucleus and right cerebellum. They etiology of symptoms is currently unclear. There is no current evidence of motor neuron disease or parkinsonism.    Plan: -Will continue to closely monitor symptoms for progression -May consider repeating EMG if new weakness develops -Patient instructed to call if having new or worsening symptoms   Since their last visit: Patient is doing well. He thinks his symptoms are stable. His voice and PBA are similar to prior per his report.  He denies any eye weakness. He denies difficulty swallowing unless it is cold water which can give him issues. He denies coughing while eating or drinking. He denies any SOB or orthopnea. He denies any significant weakness. He denies any pain, numbness, or tingling. He denies imbalance or falls.  He endorses some occasional constipation. He thinks this is getting  better though.  He continues to follow with Dr. Federico for IgA kappa MGUS.   MEDICATIONS:  Outpatient Encounter Medications as of 06/28/2024  Medication Sig   aspirin EC 81 MG tablet Take 81 mg by mouth daily.   atorvastatin  (LIPITOR) 20 MG tablet TAKE 1 TABLET BY MOUTH EVERY DAY   losartan (COZAAR) 25 MG tablet Take 25 mg daily by mouth.   nitroGLYCERIN (NITROSTAT) 0.4 MG SL tablet Place 0.4 mg under the tongue every 5 (five) minutes as needed for chest pain.   Ferrous Sulfate (IRON PO) Take by mouth. (Patient not taking: Reported on 06/28/2024)   No facility-administered encounter medications on file as of 06/28/2024.    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   HTN (hypertension) 07/09/2017   Seasonal allergies 07/09/2017   Tinnitus 07/09/2017   Varicose vein of leg 07/09/2017    PAST SURGICAL HISTORY: Past Surgical History:  Procedure Laterality Date   CORONARY ARTERY BYPASS GRAFT     HERNIA REPAIR Right 2005   NOSE SURGERY  1981   TRANSURETHRAL RESECTION OF PROSTATE  1988   VARICOSE VEIN SURGERY Left 2014    ALLERGIES: Allergies[1]  FAMILY HISTORY: Family History  Problem Relation Age of Onset   Cancer - Other Mother        LIVER CANCER   Cancer Father        PANCREATIC   Diabetes Father    Hypertension Father    CAD Sister    CAD Sister    CAD Brother    Lymphoma Brother     SOCIAL HISTORY: Social History[2] Social History   Social History Narrative   Right handed   Lives with wife one story home   Retired    No Caffeine    Objective:  Vital Signs:  BP (!) 151/85   Pulse 61   Ht 6' 1 (1.854 m)   Wt 192 lb (87.1 kg)   SpO2 98%   BMI 25.33 kg/m    General: General appearance: Awake and alert. No distress. Cooperative with exam.  Skin: No obvious rash or jaundice. HEENT: Atraumatic. Anicteric. Lungs:  Non-labored breathing on room air  Heart: Regular Extremities: No edema. No obvious deformity.  Musculoskeletal: No obvious joint swelling.  Neurological: Mental Status: Alert. Speech fluent. Positive for pseudobulbar affect Cranial Nerves: CNII: No RAPD. Visual fields intact. CNIII, IV, VI: PERRL. No nystagmus. EOMI. CN V: Facial sensation intact bilaterally to fine touch. CN VII: Facial muscles symmetric and strong. No ptosis at rest. CN VIII: Hears finger rub well bilaterally. CN IX: No hypophonia. CN X: Palate elevates symmetrically. CN XI: Full strength shoulder shrug bilaterally. CN XII: Tongue protrusion full and midline. No atrophy or fasciculations. Moderate dysarthria - unchanged from prior Motor: Tone is normal. No obvious fasciculations in extremities. No obvious atrophy.  Individual muscle group testing (MRC grade out of 5):  Movement     Neck flexion 5    Neck extension 5     Right Left   Shoulder abduction 5 5   Shoulder adduction 5 5   Shoulder ext rotation 5 5   Shoulder int rotation 5 5   Elbow flexion 5 5   Elbow extension 5 5   Finger abduction - FDI 5 5   Finger abduction - ADM 5 5   Finger extension 5 5   Finger distal flexion - 2/3 5 5    Finger distal flexion - 4/5 5 5    Thumb flexion - FPL 5 5   Thumb abduction - APB 5 5    Hip flexion 5 5   Hip extension 5 5   Hip adduction 5 5   Hip abduction 5 5   Knee extension 5 5   Knee flexion 5 5   Dorsiflexion 5 5   Plantarflexion 5 5    Reflexes:  Right Left  Bicep 2+ 2+  Tricep 2+ 2+  BrRad 2+ 2+  Knee 2+ 2+  Ankle 1+ 1+   Sensation: Pinprick: Intact in all extremities Vibration: intact in all extremities Coordination: Intact finger-to- nose-finger bilaterally. Romberg negative. RAM with mild incoordination of finger tapping  and hand opening and right foot tapping Gait: Able to rise from chair with arms crossed unassisted. Wide based gait. Mild ataxia, worse with turns. No freezing or en bloc turns.   Lab and Test Review: New results: 06/06/24: MM panel: elevated IgA, no M spike on SPEP; polyclonal increased detected on IFE LDH wnl MMA wnl B12: 2479 K/L light chains: ratio increased to 1.78 CMP significant for glucose 128 CBC w/ diff unremarkable  Previously reviewed results: 05/18/23: MM panel: IgA elevated to 615 (similar to prior); M protein not observed K/L light chain ratio wnl CBC w/ diff unremarkable CMP unremarkable   Vit D (11/04/22) wnl   07/03/22: Vit D: 18.4 HbA1c: 5.8   Normal or unremarkable: lyme, heavy metals, PTH, copper , TSH, LDH MG panel (05/20/21): negative IFE and SPEP (06/26/21): poorly defined region of restricted mobility (IgA kappa) B12 (06/26/21): 231; repeat on 07/29/21: 582 with normal MMA     MRI cervical spine wo contrast (07/14/21): FINDINGS: Alignment: Straightening and slight reversal of the normal cervical lordosis. Trace anterolisthesis at C2-C3.   Vertebrae: No fracture, evidence of discitis, or bone lesion. Small hemangioma at base of the dens.   Cord: Normal signal and morphology.   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 Otherwise negative.   Disc levels:   C2-C3: Negative disc. Moderate right and mild left facet arthropathy. No stenosis.   C3-C4: Tiny shallow broad-based posterior disc protrusion slightly  eccentric to the right. Mild right uncovertebral hypertrophy. Moderate bilateral facet arthropathy. Mild right neuroforaminal stenosis. No spinal canal or left neuroforaminal stenosis.   C4-C5: Negative disc. Moderate right and mild left facet arthropathy. No stenosis.   C5-C6: Small posterior disc osteophyte complex eccentric to the left. Left greater than  right facet uncovertebral hypertrophy. Slight flattening of the left ventral cord with borderline mild spinal canal stenosis. Moderate to severe left neuroforaminal stenosis. No right neuroforaminal stenosis.   C6-C7: Small posterior disc osteophyte complex and mild bilateral facet uncovertebral hypertrophy. Borderline mild spinal canal stenosis. No neuroforaminal stenosis.   C7-T1: Negative disc. Moderate bilateral facet arthropathy. No stenosis.   IMPRESSION: 1. Multilevel degenerative changes of the cervical spine as described above. Moderate to severe left neuroforaminal stenosis at C5-C6. No significant spinal canal stenosis at any level.   MRI brain w/wo contrast (05/09/21): FINDINGS: Brain: Cerebral volume is within normal limits for age. No restricted diffusion to suggest acute infarction. No midline shift, mass effect, evidence of mass lesion, ventriculomegaly, extra-axial collection or acute intracranial hemorrhage. Cervicomedullary junction and pituitary are within normal limits.   There is a small chronic lacunar infarct tracking from the left corona radiata to the left lentiform. This is facilitated on diffusion. And there is a small chronic infarct in the posterior cerebellum on the right (series 13, image 6). But elsewhere generally normal for age gray and white matter signal throughout the brain. No cortical encephalomalacia or chronic cerebral blood products identified. There is minimal T2 heterogeneity in the right pons.   No abnormal enhancement identified (minimal central pontine capillary telangiectasia, normal variant). No dural thickening.   Vascular: Major intracranial vascular flow voids are preserved. The distal left vertebral artery appears to be dominant. The major dural venous sinuses are enhancing and appear to be patent.   Skull and upper cervical spine: Negative for age visible cervical spine. Visualized bone marrow signal is within normal  limits.   Sinuses/Orbits: Negative orbits. Only trace paranasal sinus mucosal thickening.   Other: Mastoid air cells are clear. Visible internal auditory structures appear normal. Negative visible scalp and face.   IMPRESSION: 1. No acute or subacute intracranial abnormality identified. 2. There are small chronic small vessel/lacunar type infarcts in the left corona radiata and lentiform, and right cerebellum.   EMG 06/24/21: Impression: Chronic C7 radiculopathy affecting the right upper extremity, mild. In isolation, mild chronic neurogenic changes involving the hyoglossus muscle are of unclear clinical significance.  Repeat electrodiagnostic testing in 6 months, if clinically indicated. There is no evidence of sensorimotor neuropathy, wide spread disorder of anterior horn cells, or diffuse myopathy.   EMG 01/01/22: Impression: As compared to prior study on 06/24/2021, there has been no change significant change.  Findings show: Chronic C7 radiculopathy affecting the right upper extremity, mild and unchanged. In isolation, mild chronic neurogenic changes involving the hyoglossus muscle is of unclear clinical significance.  Right ulnar neuropathy with slowing across the elbow, demyelinating, mild, is new.  There is no evidence of sensorimotor neuropathy, wide spread disorder of anterior horn cells, or diffuse myopathy.   MBS (02/24/22): Pt demonstrates no oral or oropharyngeal dysphagia. No penetration, aspiration or residual. Pts speech was slurred throughout assessment. Geographic tongue and slight left lingual deviation noted. Pt safe to continue a regular diet and thin liquids. Encouraged appt with SLP for dysarthria interventions. No f/u needed for swallowing at this time.   ASSESSMENT: This is [employer], a 79 y.o. male with dysarthria and pseudobulbar affect.  B12 was borderline previously. EMG x2 showed no evidence of MND. AChR abs for MG were negative. MRI brain in 2022 showed  lacunar infarcts in left corona radiata and lentiform nucleus and right cerebellum. They etiology of symptoms is currently unclear. There is no current evidence of motor neuron disease. This is a family history of parkinsonism (PSP) and patient currently has subtle signs but not definitive.  Plan: -Discussed repeating MRI brain, but patient is not currently interested -Will monitor symptoms closely -Patient will call with new or worsening symptoms. -Fall precautions discussed.  Return to clinic in 1 year  Total time spent reviewing records, interview, history/exam, documentation, and coordination of care on day of encounter:  40 min  Venetia Potters, MD     [1]  Allergies Allergen Reactions   Percocet [Oxycodone-Acetaminophen ] Other (See Comments)    hallucinations  [2]  Social History Tobacco Use   Smoking status: Former    Types: Cigarettes   Smokeless tobacco: Never  Vaping Use   Vaping status: Never Used  Substance Use Topics   Alcohol use: Not Currently    Comment: rarely   Drug use: No   "

## 2024-06-20 NOTE — Progress Notes (Unsigned)
 " [facility] [phone]   [phone]  PROGRESS NOTE  Patient Care Team: [name], MD as PCP - General (Internal Medicine) Okey Vina GAILS, MD as PCP - Cardiology (Cardiology) Tat, Asberry RAMAN, DO as Consulting Physician (Neurology) Leigh Venetia CROME, MD as Consulting Physician (Neurology)  Hematological/Oncological History # IgA Kappa Monoclonal Gammopathy of Undetermined Significance.  06/26/2021: SPEP detected M protein measuring 0.5 g/dL.  Immunofixation shows a poorly-defined area of restricted protein mobility is detected and is reactive with IgA and kappa.   07/29/2021: Establish care with Johnston Police PA-C and Dr. Federico  08/12/2021: bone marrow biopsy shows 5% plasma cells.   Interval History:  Michael Spencer 79 y.o. male with medical history significant for IgA kappa MGUS who presents for a follow up visit. The patient's last visit was on 05/20/2022. In the interim since the last visit he has had no major changes in his health.   On exam today Michael Spencer is accompanied by his wife.  He reports he has been well overall in the room since her last visit with no hospitalizations, new medications, or emergency room visits.  He reports he has not had any issues with infectious symptoms such as runny nose, sore throat, or cough.  He denies any new bone or back pain.  He is also had no changes in his urine.  He reports his energy levels today are about a 9 out of 10 and his appetite remains strong.  He reports he likes to donate platelets and is surprised his platelet level count is high.  We discussed how platelets regenerate.. Overall he is at his baseline level of health with no recent changes.  He otherwise denies any fevers, chills, sweats, nausea, vomiting or diarrhea.  Full 10 point ROS is listed below.  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   HTN (hypertension) 07/09/2017   Seasonal allergies 07/09/2017   Tinnitus 07/09/2017   Varicose vein of leg 07/09/2017    SURGICAL HISTORY: Past Surgical History:  Procedure Laterality Date   CORONARY ARTERY BYPASS GRAFT     HERNIA REPAIR Right 2005   NOSE SURGERY  1981   TRANSURETHRAL RESECTION OF PROSTATE  1988   VARICOSE VEIN SURGERY Left 2014    SOCIAL HISTORY: Social History   Socioeconomic History   Marital status: Married    Spouse name: Not on file   Number of children: Not on file   Years of education: Not on file   Highest education level: Not on file  Occupational History   Occupation: retired    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  Tobacco Use   Smoking status: Former    Types: Cigarettes   Smokeless tobacco: Never  Vaping Use   Vaping status: Never Used  Substance and Sexual Activity   Alcohol use: Not Currently    Comment: rarely   Drug use: No   Sexual activity: Not on file  Other Topics Concern   Not on file  Social History Narrative   Right handed   Lives with wife one story home   Retired    No Caffeine   Social Drivers of  Health   Tobacco Use: Medium Risk (06/23/2023)   Patient History    Smoking Tobacco Use: Former    Smokeless Tobacco Use: Never    Passive Exposure: Not on Actuary Strain: Not on file  Food Insecurity: Not on file  Transportation Needs: Not on file  Physical Activity: Not on file  Stress: Not on file  Social Connections: Not on file  Intimate Partner Violence: Not on file  Depression (EYV7-0): Not on file  Alcohol Screen: Not on file  Housing: Not on file  Utilities: Not on file  Health Literacy: Not on file    FAMILY HISTORY: Family History  Problem Relation Age of Onset   Cancer - Other  Mother        LIVER CANCER   Cancer Father        PANCREATIC   Diabetes Father    Hypertension Father    CAD Sister    CAD Sister    CAD Brother    Lymphoma Brother     ALLERGIES:  is allergic to percocet [oxycodone-acetaminophen ].  MEDICATIONS:  Current Outpatient Medications  Medication Sig Dispense Refill   aspirin EC 81 MG tablet Take 81 mg by mouth daily.     atorvastatin  (LIPITOR) 20 MG tablet TAKE 1 TABLET BY MOUTH EVERY DAY 90 tablet 3   Ferrous Sulfate (IRON PO) Take by mouth.     losartan (COZAAR) 25 MG tablet Take 25 mg daily by mouth.  2   nitroGLYCERIN (NITROSTAT) 0.4 MG SL tablet Place 0.4 mg under the tongue every 5 (five) minutes as needed for chest pain.     No current facility-administered medications for this visit.    REVIEW OF SYSTEMS:   Constitutional: ( - ) fevers, ( - )  chills , ( - ) night sweats Eyes: ( - ) blurriness of vision, ( - ) double vision, ( - ) watery eyes Ears, nose, mouth, throat, and face: ( - ) mucositis, ( - ) sore throat Respiratory: ( - ) cough, ( - ) dyspnea, ( - ) wheezes Cardiovascular: ( - ) palpitation, ( - ) chest discomfort, ( - ) lower extremity swelling Gastrointestinal:  ( - ) nausea, ( - ) heartburn, ( - ) change in bowel habits Skin: ( - ) abnormal skin rashes Lymphatics: ( - ) new lymphadenopathy, ( - ) easy bruising Neurological: ( - ) numbness, ( - ) tingling, ( - ) new weaknesses Behavioral/Psych: ( - ) mood change, ( - ) new changes  All other systems were reviewed with the patient and are negative.  PHYSICAL EXAMINATION:  There were no vitals filed for this visit.   There were no vitals filed for this visit.    GENERAL: Well-appearing elderly Caucasian male, alert, no distress and comfortable SKIN: skin color, texture, turgor are normal, no rashes or significant lesions EYES: conjunctiva are pink and non-injected, sclera clear LUNGS: clear to auscultation and percussion with normal breathing  effort HEART: regular rate & rhythm and no murmurs and no lower extremity edema Musculoskeletal: no cyanosis of digits and no clubbing  PSYCH: alert & oriented x 3, fluent speech NEURO: no focal motor/sensory deficits  LABORATORY DATA:  I have reviewed the data as listed    Latest Ref Rng & Units 06/06/2024    9:18 AM 05/18/2023   10:06 AM 05/13/2022   10:11 AM  CBC  WBC 4.0 - 10.5 K/uL 6.5  6.1  6.8   Hemoglobin 13.0 -  17.0 g/dL 83.8  84.6  85.5   Hematocrit 39.0 - 52.0 % 47.9  46.1  43.8   Platelets 150 - 400 K/uL 222  211  180        Latest Ref Rng & Units 06/06/2024    9:18 AM 05/18/2023   10:06 AM 05/13/2022   10:11 AM  CMP  Glucose 70 - 99 mg/dL 871  894  885   BUN 8 - 23 mg/dL 11  19  14    Creatinine 0.61 - 1.24 mg/dL 8.90  9.05  9.10   Sodium 135 - 145 mmol/L 137  137  137   Potassium 3.5 - 5.1 mmol/L 4.1  3.8  3.9   Chloride 98 - 111 mmol/L 100  103  103   CO2 22 - 32 mmol/L 29  29  29    Calcium  8.9 - 10.3 mg/dL 9.3  8.9  9.0   Total Protein 6.5 - 8.1 g/dL 7.7  6.9  6.5   Total Bilirubin 0.0 - 1.2 mg/dL 0.9  1.1  0.9   Alkaline Phos 38 - 126 U/L 80  71  66   AST 15 - 41 U/L 16  14  12    ALT 0 - 44 U/L 14  14  12      Lab Results  Component Value Date   MPROTEIN Not Observed 06/06/2024   MPROTEIN Not Observed 05/18/2023   MPROTEIN Comment (A) 05/13/2022   Lab Results  Component Value Date   KPAFRELGTCHN 56.5 (H) 06/06/2024   KPAFRELGTCHN 41.0 (H) 05/18/2023   KPAFRELGTCHN 39.0 (H) 05/13/2022   LAMBDASER 31.7 (H) 06/06/2024   LAMBDASER 31.2 (H) 05/18/2023   LAMBDASER 27.3 (H) 05/13/2022   KAPLAMBRATIO 1.78 (H) 06/06/2024   KAPLAMBRATIO 1.31 05/18/2023   KAPLAMBRATIO 1.43 05/13/2022    RADIOGRAPHIC STUDIES: No results found.  ASSESSMENT & PLAN Michael Spencer 79 y.o. male with medical history significant for IgA kappa MGUS who presents for a follow up visit.   #IgA Kappa Monoclonal Gammopathy of Undetermined Significance --Bone marrow biopsy performed  in March 2023 showed 5% plasma cells.  Findings most consistent with MGUS --Labs from 06/06/2024 showed M protein not measurable. Kappa light chain has increased to 56.5, ratio 1.78. CBC showed no cytopenias. CMP showed normal renal function and calcium  levels.  --Most recent bone met survey from 08/11/2021 showed no lytic lesions. Next one due now.  --Most recent 24 hour UPEP from 07/31/2021 was unremarkable. Next one due now.  --Return to clinic in 12 months time.   No orders of the defined types were placed in this encounter.   All questions were answered. The patient knows to call the clinic with any problems, questions or concerns.   I have spent a total of {CHL ONC TIME VISIT - DTPQU:[id number]} minutes of face-to-face and non-face-to-face time, preparing to see the patient, obtaining and/or reviewing separately obtained history, performing a medically appropriate examination, counseling and educating the patient, ordering medications/tests/procedures, referring and communicating with other health care professionals, documenting clinical information in the electronic health record, independently interpreting results and communicating results to the patient, and care coordination.   Johnston Police PA-C Dept of Hematology and Oncology [facility] at [facility] Phone: [phone]   06/20/2024 9:53 PM "

## 2024-06-21 ENCOUNTER — Inpatient Hospital Stay: Admitting: Physician Assistant

## 2024-06-21 VITALS — BP 138/78 | HR 61 | Temp 97.5°F | Resp 18 | Wt 192.3 lb

## 2024-06-21 DIAGNOSIS — D472 Monoclonal gammopathy: Secondary | ICD-10-CM | POA: Diagnosis not present

## 2024-06-26 NOTE — Progress Notes (Signed)
 "   Cardiology Office Note   Date:  06/30/2024   ID:  Michael Spencer, DOB [birth year], MRN [id number]  PCP:  [name], MD  Cardiologist:   Vina Gull, MD   Pt presents for f/u of CAD     History of Present Illness: Michael Spencer is a 79 y.o. male with a history of CAD (s/p CABG in 2012, [place]), HTN, HL, varicose veins   I saw the pt in Jan 2025  Since seen he says he is doing good   Volunteering at hospital   Walks a lot  No dizziness  No palpitations  No SOB  NO CP BP at home in 140s   [facility] today because lots of doctors visits   Allergies:   Percocet [oxycodone-acetaminophen ]   Past Medical History:  Diagnosis Date   BPH (benign prostatic hyperplasia)    Broken ribs 07/09/2017   CAD (coronary artery disease) 07/09/2017   Collapsed lung 07/09/2017   DUE TO SLIPPING ON ICE IN [place]  2009   Coronary artery disease    History of kidney stones 07/09/2017   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 His smoking use included cigarettes. He has never used smokeless tobacco. He reports that he does not currently use alcohol. He reports that he does not use drugs.   Family History:  The patient's family history includes CAD in his brother, sister, and sister; Cancer in his father; Cancer - Other in his mother; Diabetes in his father; Hypertension in his father; Lymphoma in his brother.    ROS:  Please see the history of present illness. All other systems are reviewed and  Negative to the above problem except as noted.    PHYSICAL EXAM: VS:  BP (!) 160/83 (BP Location: Left Arm,  Patient Position: Sitting, Cuff Size: Normal)   Pulse 63   Resp 17   Ht 6' 1 (1.854 m)   Wt 192 lb (87.1 kg)   SpO2 96%   BMI 25.33 kg/m    GEN: Well nourished, well developed, in NAD HEENT: normal  Neck: JVP is not elevated  No bruits Cardiac: RRR; no murmurs Respiratory:  clear to auscultation GI: soft, nontender,No hepatomegaly    Ext  No LE edema   EKG:  EKG shows SB 64 bpm  Occasional PVC   First degree AV block  PR 204 msec  Incomp RBBB    IWMI Lipid Panel No results found for: CHOL, TRIG, HDL, CHOLHDL, VLDL, LDLCALC, LDLDIRECT    Wt Readings from Last 3 Encounters:  06/30/24 192 lb (87.1 kg)  06/28/24 192 lb (87.1 kg)  06/21/24 192 lb 4.8 oz (87.2 kg)      ASSESSMENT AND PLAN:  1  CAD    CABG in 2012  [place]ovue done when he lived in [place]  in 2018   No ischemia    Remains symptom free Active   I would continue to follow   2  HTN  BP is a little high   I would increase losartan   to 2 25 mg tabs in AM  Follow BP   If too much can back off   3  HL    LDL 58  HDL 49  Trig 80  March 2025  Keep on lipitor  Will have labs soon through PCP   4  CV dz   Mild plaquing in 2019   Rx lipids   F/U in 1 year    Sooner for problems      Current medicines are reviewed at length with the patient today.  The patient does not have concerns regarding medicines.  Signed, Vina Gull, MD  "

## 2024-06-28 ENCOUNTER — Ambulatory Visit (HOSPITAL_COMMUNITY)
Admission: RE | Admit: 2024-06-28 | Discharge: 2024-06-28 | Disposition: A | Discharge status: Home / Self Care | Source: Ambulatory Visit | Attending: Physician Assistant | Admitting: Physician Assistant

## 2024-06-28 ENCOUNTER — Ambulatory Visit: Payer: Medicare Other | Admitting: Neurology

## 2024-06-28 ENCOUNTER — Encounter: Payer: Self-pay | Admitting: Neurology

## 2024-06-28 VITALS — BP 150/88 | HR 61 | Ht 73.0 in | Wt 192.0 lb

## 2024-06-28 DIAGNOSIS — E538 Deficiency of other specified B group vitamins: Secondary | ICD-10-CM | POA: Diagnosis not present

## 2024-06-28 DIAGNOSIS — R4781 Slurred speech: Secondary | ICD-10-CM | POA: Diagnosis not present

## 2024-06-28 DIAGNOSIS — D472 Monoclonal gammopathy: Secondary | ICD-10-CM | POA: Diagnosis not present

## 2024-06-28 DIAGNOSIS — F482 Pseudobulbar affect: Secondary | ICD-10-CM

## 2024-06-28 NOTE — Patient Instructions (Signed)
 Your examination looks similar to last year. We will continue to monitor your symptoms closely.  Please reach out if you have any changing or new symptoms.  Otherwise I will see you again in about 1 year.  Please let me know if you have any questions or concerns in the meantime.  The physicians and staff at [facility] are committed to providing excellent care. You may receive a survey requesting feedback about your experience at our office. We strive to receive very good responses to the survey questions. If you feel that your experience would prevent you from giving the office a very good  response, please contact our office to try to remedy the situation. We may be reached at [phone]. Thank you for taking the time out of your busy day to complete the survey.  Venetia Potters, MD [facility]  Preventing Falls at [facility] are common, often dreaded events in the lives of older people. Aside from the obvious injuries and even death that may result, fall can cause wide-ranging consequences including loss of independence, mental decline, decreased activity and mobility. Younger people are also at risk of falling, especially those with chronic illnesses and fatigue.  Ways to reduce risk for falling Examine diet and medications. Warm foods and alcohol dilate blood vessels, which can lead to dizziness when standing. Sleep aids, antidepressants and pain medications can also increase the likelihood of a fall.  Get a vision exam. Poor vision, cataracts and glaucoma increase the chances of falling.  Check foot gear. Shoes should fit snugly and have a sturdy, nonskid sole and a broad, low heel  Participate in a physician-approved exercise program to build and maintain muscle strength and improve balance and coordination. Programs that use ankle weights or stretch bands are excellent for muscle-strengthening. Water aerobics programs and low-impact Tai Chi programs have also been shown  to improve balance and coordination.  Increase vitamin D  intake. Vitamin D  improves muscle strength and increases the amount of calcium  the body is able to absorb and deposit in bones.  How to prevent falls from common hazards Floors - Remove all loose wires, cords, and throw rugs. Minimize clutter. Make sure rugs are anchored and smooth. Keep furniture in its usual place.  Chairs -- Use chairs with straight backs, armrests and firm seats. Add firm cushions to existing pieces to add height.  Bathroom - Install grab bars and non-skid tape in the tub or shower. Use a bathtub transfer bench or a shower chair with a back support Use an elevated toilet seat and/or safety rails to assist standing from a low surface. Do not use towel racks or bathroom tissue holders to help you stand.  Lighting - Make sure halls, stairways, and entrances are well-lit. Install a night light in your bathroom or hallway. Make sure there is a light switch at the top and bottom of the staircase. Turn lights on if you get up in the middle of the night. Make sure lamps or light switches are within reach of the bed if you have to get up during the night.  Kitchen - Install non-skid rubber mats near the sink and stove. Clean spills immediately. Store frequently used utensils, pots, pans between waist and eye level. This helps prevent reaching and bending. Sit when getting things out of lower cupboards.  Living room/ Bedrooms - Place furniture with wide spaces in between, giving enough room to move around. Establish a route through the living room that gives you something to  hold onto as you walk.  Stairs - Make sure treads, rails, and rugs are secure. Install a rail on both sides of the stairs. If stairs are a threat, it might be helpful to arrange most of your activities on the lower level to reduce the number of times you must climb the stairs.  Entrances and doorways - Install metal handles on the walls adjacent to the  doorknobs of all doors to make it more secure as you travel through the doorway.  Tips for maintaining balance Keep at least one hand free at all times. Try using a backpack or fanny pack to hold things rather than carrying them in your hands. Never carry objects in both hands when walking as this interferes with keeping your balance.  Attempt to swing both arms from front to back while walking. This might require a conscious effort if Parkinson's disease has diminished your movement. It will, however, help you to maintain balance and posture, and reduce fatigue.  Consciously lift your feet off of the ground when walking. Shuffling and dragging of the feet is a common culprit in losing your balance.  When trying to navigate turns, use a U technique of facing forward and making a wide turn, rather than pivoting sharply.  Try to stand with your feet shoulder-length apart. When your feet are close together for any length of time, you increase your risk of losing your balance and falling.  Do one thing at a time. Don't try to walk and accomplish another task, such as reading or looking around. The decrease in your automatic reflexes complicates motor function, so the less distraction, the better.  Do not wear rubber or gripping soled shoes, they might catch on the floor and cause tripping.  Move slowly when changing positions. Use deliberate, concentrated movements and, if needed, use a grab bar or walking aid. Count 15 seconds between each movement. For example, when rising from a seated position, wait 15 seconds after standing to begin walking.  If balance is a continuous problem, you might want to consider a walking aid such as a cane, walking stick, or walker. Once you've mastered walking with help, you might be ready to try it on your own again.

## 2024-06-30 ENCOUNTER — Encounter: Payer: Self-pay | Admitting: Internal Medicine

## 2024-06-30 ENCOUNTER — Ambulatory Visit: Attending: Internal Medicine | Admitting: Internal Medicine

## 2024-06-30 ENCOUNTER — Other Ambulatory Visit: Payer: Self-pay | Admitting: Internal Medicine

## 2024-06-30 VITALS — BP 160/83 | HR 63 | Resp 17 | Ht 73.0 in | Wt 192.0 lb

## 2024-06-30 DIAGNOSIS — I2510 Atherosclerotic heart disease of native coronary artery without angina pectoris: Secondary | ICD-10-CM | POA: Diagnosis not present

## 2024-06-30 MED ORDER — LOSARTAN POTASSIUM 25 MG PO TABS: 50.0000 mg | ORAL_TABLET | Freq: Every day | ORAL | 3 refills | Status: AC

## 2024-06-30 MED ORDER — LOSARTAN POTASSIUM 25 MG PO TABS: 25.0000 mg | ORAL_TABLET | Freq: Every day | ORAL | 3 refills | Status: DC

## 2024-06-30 NOTE — Telephone Encounter (Signed)
 Medication refilled   Ov 06/30/24

## 2024-06-30 NOTE — Patient Instructions (Signed)
 Medication Instructions:    Switch to taking (2)  25 mg tablet every morning  - ( may take just 25 mg If needed for low blood pressure   *If you need a refill on your cardiac medications before your next appointment, please call your pharmacy*   Lab Work: Not needed    Testing/Procedures:  Not needed  Follow-Up: At [facility], you and your health needs are our priority.  As part of our continuing mission to provide you with exceptional heart care, we have created designated Provider Care Teams.  These Care Teams include your primary Cardiologist (physician) and Advanced Practice Providers (APPs -  Physician Assistants and Nurse Practitioners) who all work together to provide you with the care you need, when you need it.     Your next appointment:   12 month(s)  The format for your next appointment:   In Person  Provider:   Vina Gull, MD   Other Instructions

## 2024-07-06 ENCOUNTER — Telehealth: Payer: Self-pay | Admitting: Physician Assistant

## 2024-07-06 DIAGNOSIS — M899 Disorder of bone, unspecified: Secondary | ICD-10-CM

## 2024-07-06 NOTE — Telephone Encounter (Signed)
 I called Mr. Michael Spencer to review the bone met survey results from 06/28/2024. Findings showed small lytic lesions involving the occipital region of the calvarium and proximal left humeral diaphysis.   Recommend PET/CT scan to further evaluation for myeloma lesions. Mr. [name] is agreeable to further evaluate. I will follow up with PET scan results.

## 2024-07-20 ENCOUNTER — Ambulatory Visit (HOSPITAL_COMMUNITY)

## 2025-06-20 ENCOUNTER — Inpatient Hospital Stay

## 2025-07-04 ENCOUNTER — Inpatient Hospital Stay: Admitting: Physician Assistant

## 2025-07-04 ENCOUNTER — Ambulatory Visit: Payer: Self-pay | Admitting: Neurology
